# Patient Record
Sex: Female | Born: 1975
Health system: Southern US, Community
[De-identification: ages and names within clinical notes are randomized; demographics above are authoritative.]

## PROBLEM LIST (undated history)

## (undated) DIAGNOSIS — K219 Gastro-esophageal reflux disease without esophagitis: Secondary | ICD-10-CM

## (undated) DIAGNOSIS — E7800 Pure hypercholesterolemia, unspecified: Secondary | ICD-10-CM

## (undated) DIAGNOSIS — G43009 Migraine without aura, not intractable, without status migrainosus: Secondary | ICD-10-CM

## (undated) DIAGNOSIS — K589 Irritable bowel syndrome without diarrhea: Secondary | ICD-10-CM

## (undated) DIAGNOSIS — F32A Depression, unspecified: Secondary | ICD-10-CM

## (undated) DIAGNOSIS — F419 Anxiety disorder, unspecified: Secondary | ICD-10-CM

## (undated) DIAGNOSIS — F329 Major depressive disorder, single episode, unspecified: Secondary | ICD-10-CM

## (undated) DIAGNOSIS — G2581 Restless legs syndrome: Secondary | ICD-10-CM

## (undated) HISTORY — DX: Migraine without aura, not intractable, without status migrainosus: G43.009

## (undated) HISTORY — PX: CHOLECYSTECTOMY, LAPAROSCOPIC: SHX56

## (undated) HISTORY — DX: Anxiety disorder, unspecified: F41.9

## (undated) HISTORY — DX: Restless legs syndrome: G25.81

## (undated) HISTORY — DX: Irritable bowel syndrome, unspecified: K58.9

## (undated) HISTORY — DX: Depression, unspecified: F32.A

## (undated) HISTORY — PX: CHOLECYSTECTOMY: SHX55

## (undated) HISTORY — PX: TONSILLECTOMY AND ADENOIDECTOMY: SUR1326

## (undated) HISTORY — PX: TUBAL LIGATION: SHX77

## (undated) HISTORY — DX: Pure hypercholesterolemia, unspecified: E78.00

---
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
## 1999-08-16 ENCOUNTER — Other Ambulatory Visit: Admission: RE | Admit: 1999-08-16 | Discharge: 1999-08-16 | Payer: Self-pay | Admitting: Obstetrics & Gynecology

## 2000-01-17 ENCOUNTER — Other Ambulatory Visit: Admission: RE | Admit: 2000-01-17 | Discharge: 2000-01-17 | Payer: Self-pay | Admitting: Obstetrics & Gynecology

## 2000-05-11 ENCOUNTER — Ambulatory Visit (HOSPITAL_COMMUNITY): Admission: RE | Admit: 2000-05-11 | Discharge: 2000-05-11 | Payer: Self-pay | Admitting: Unknown Physician Specialty

## 2000-07-25 ENCOUNTER — Inpatient Hospital Stay (HOSPITAL_COMMUNITY): Admission: AD | Admit: 2000-07-25 | Discharge: 2000-07-25 | Payer: Self-pay | Admitting: Obstetrics & Gynecology

## 2000-07-31 ENCOUNTER — Inpatient Hospital Stay (HOSPITAL_COMMUNITY): Admission: AD | Admit: 2000-07-31 | Discharge: 2000-07-31 | Payer: Self-pay | Admitting: Obstetrics and Gynecology

## 2000-08-01 ENCOUNTER — Inpatient Hospital Stay (HOSPITAL_COMMUNITY): Admission: AD | Admit: 2000-08-01 | Discharge: 2000-08-04 | Payer: Self-pay | Admitting: Obstetrics & Gynecology

## 2001-01-13 ENCOUNTER — Emergency Department (HOSPITAL_COMMUNITY): Admission: EM | Admit: 2001-01-13 | Discharge: 2001-01-13 | Payer: Self-pay | Admitting: *Deleted

## 2002-09-21 ENCOUNTER — Other Ambulatory Visit: Admission: RE | Admit: 2002-09-21 | Discharge: 2002-09-21 | Payer: Self-pay | Admitting: Obstetrics & Gynecology

## 2003-09-07 ENCOUNTER — Ambulatory Visit (HOSPITAL_COMMUNITY): Admission: RE | Admit: 2003-09-07 | Discharge: 2003-09-07 | Payer: Self-pay | Admitting: Family Medicine

## 2003-10-12 ENCOUNTER — Other Ambulatory Visit: Admission: RE | Admit: 2003-10-12 | Discharge: 2003-10-12 | Payer: Self-pay | Admitting: Obstetrics & Gynecology

## 2004-05-02 ENCOUNTER — Encounter: Admission: RE | Admit: 2004-05-02 | Discharge: 2004-05-02 | Payer: Self-pay | Admitting: Family Medicine

## 2004-05-06 ENCOUNTER — Ambulatory Visit (HOSPITAL_COMMUNITY): Admission: RE | Admit: 2004-05-06 | Discharge: 2004-05-07 | Payer: Self-pay | Admitting: Surgery

## 2004-05-10 ENCOUNTER — Emergency Department (HOSPITAL_COMMUNITY): Admission: EM | Admit: 2004-05-10 | Discharge: 2004-05-10 | Payer: Self-pay | Admitting: Emergency Medicine

## 2004-05-11 ENCOUNTER — Inpatient Hospital Stay (HOSPITAL_COMMUNITY): Admission: EM | Admit: 2004-05-11 | Discharge: 2004-05-15 | Payer: Self-pay | Admitting: Emergency Medicine

## 2004-11-10 HISTORY — PX: WISDOM TOOTH EXTRACTION: SHX21

## 2004-12-12 ENCOUNTER — Other Ambulatory Visit: Admission: RE | Admit: 2004-12-12 | Discharge: 2004-12-12 | Payer: Self-pay | Admitting: Obstetrics & Gynecology

## 2006-01-13 ENCOUNTER — Other Ambulatory Visit: Admission: RE | Admit: 2006-01-13 | Discharge: 2006-01-13 | Payer: Self-pay | Admitting: Obstetrics & Gynecology

## 2006-11-07 ENCOUNTER — Inpatient Hospital Stay (HOSPITAL_COMMUNITY): Admission: RE | Admit: 2006-11-07 | Discharge: 2006-11-08 | Payer: Self-pay | Admitting: Obstetrics & Gynecology

## 2007-02-22 IMAGING — US US PELVIS COMPLETE
1 series · 14 of 25 positions shown · non-contrast
Comparison: NONE

CLINICAL DATA: Follow-up to CT, growth on right side near uterus 

PELVIC ULTRASOUND

[Series 1: us pelvic · 0.32mm/px · 14 of 40 slices shown]
[im 1/40]
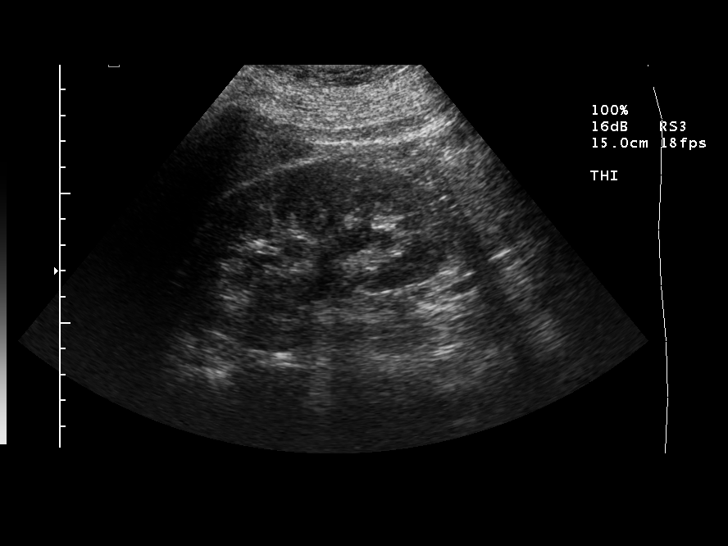
[im 4/40]
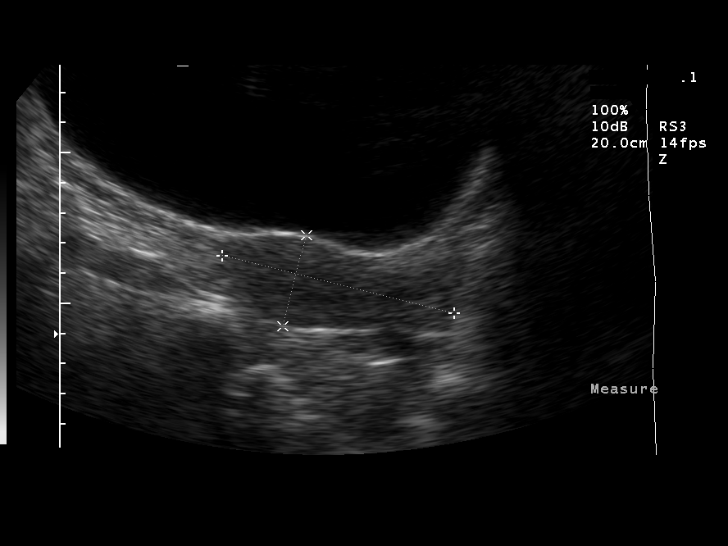
[im 7/40]
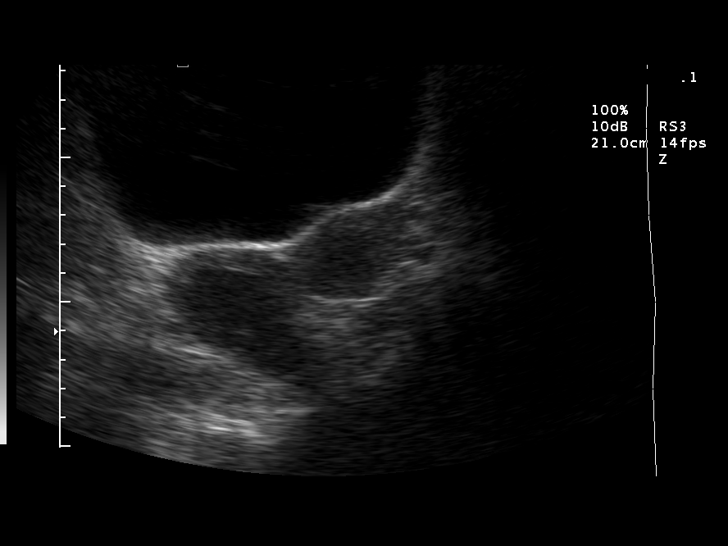
[im 10/40]
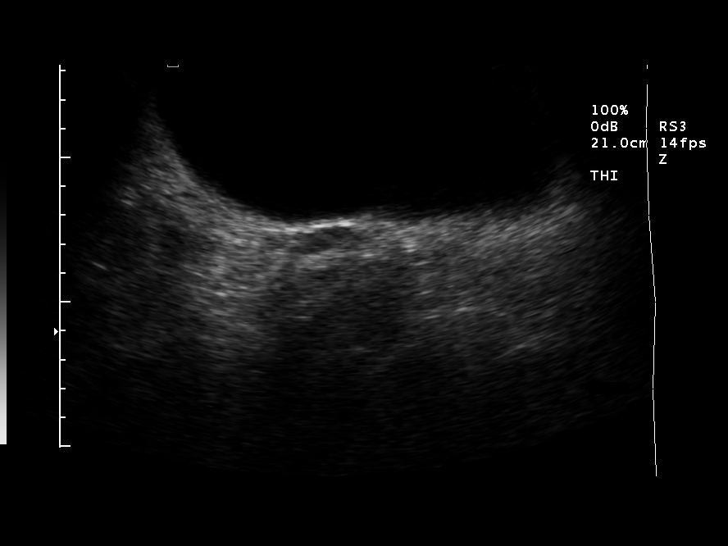
[im 14/40]
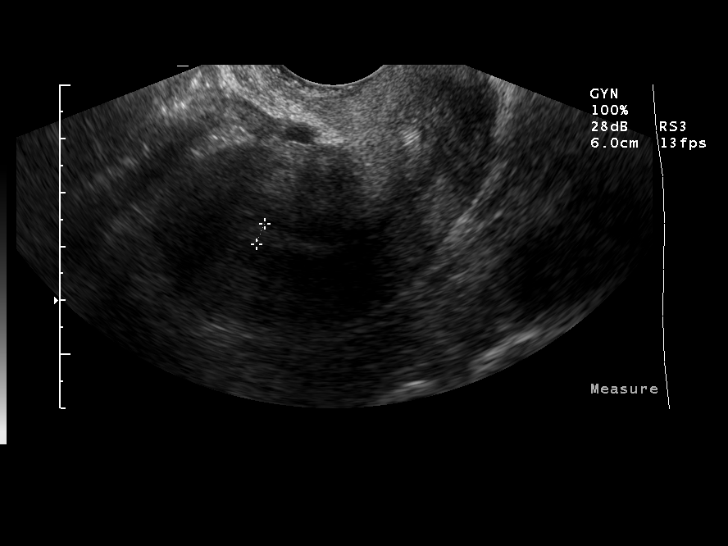
[im 15/40]
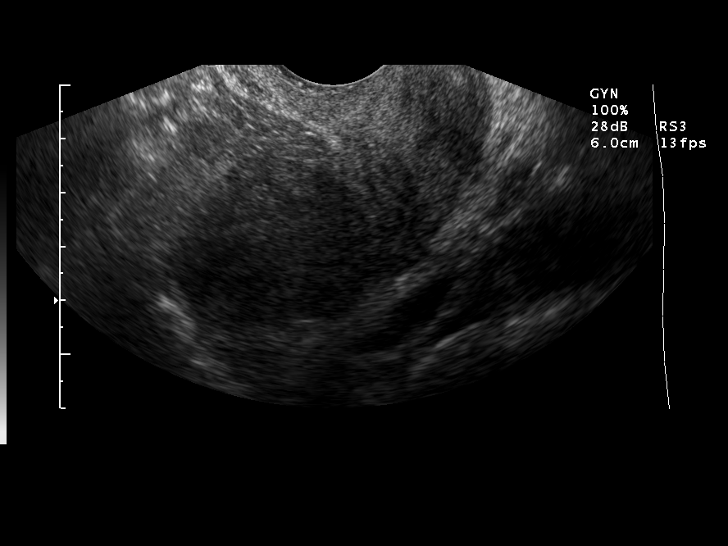
[im 18/40]
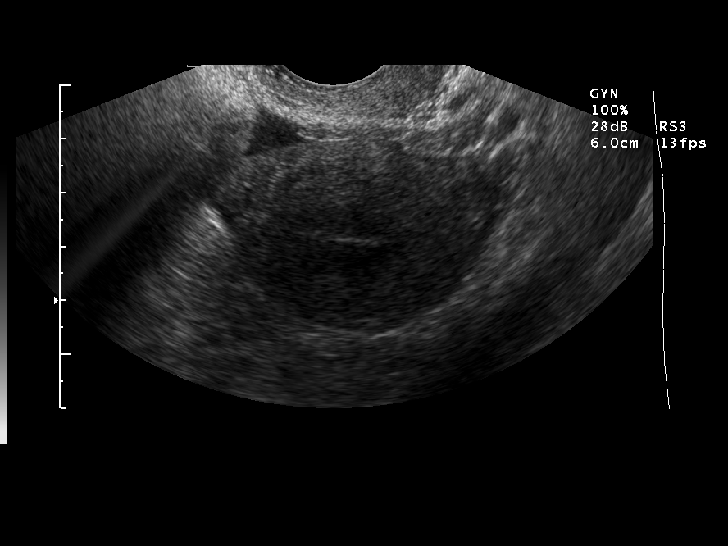
[im 22/40]
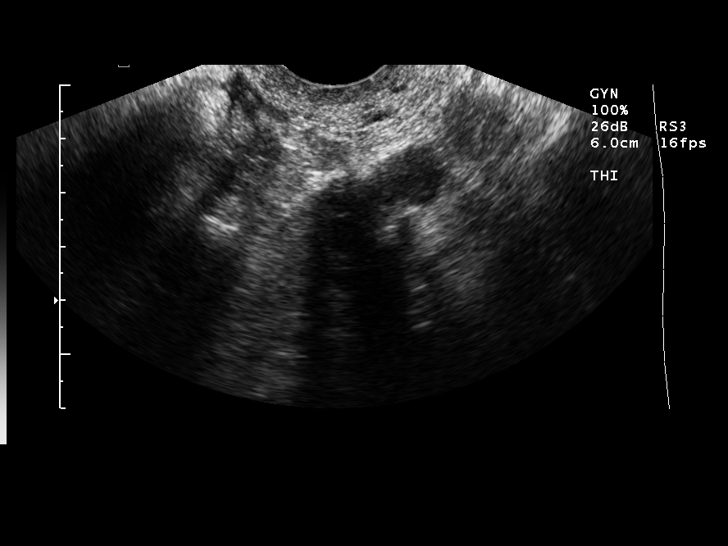
[im 25/40]
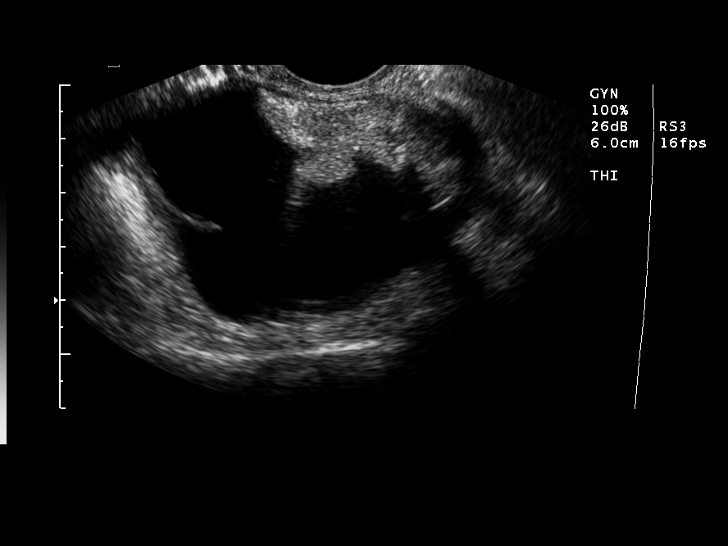
[im 27/40]
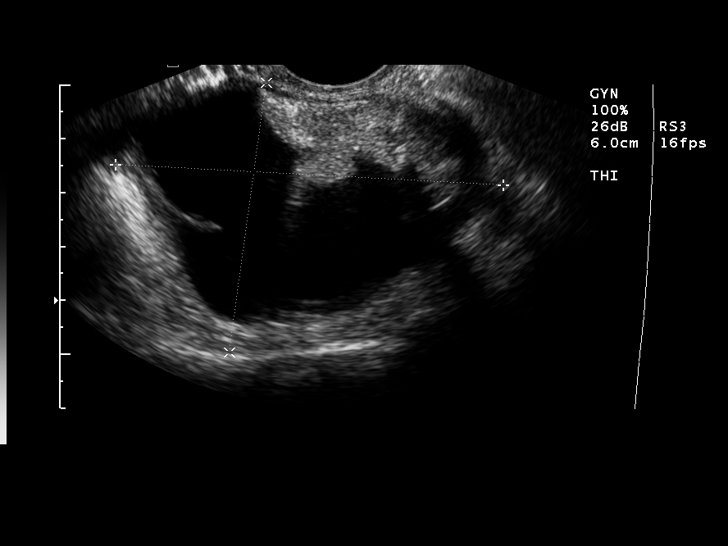
[im 30/40]
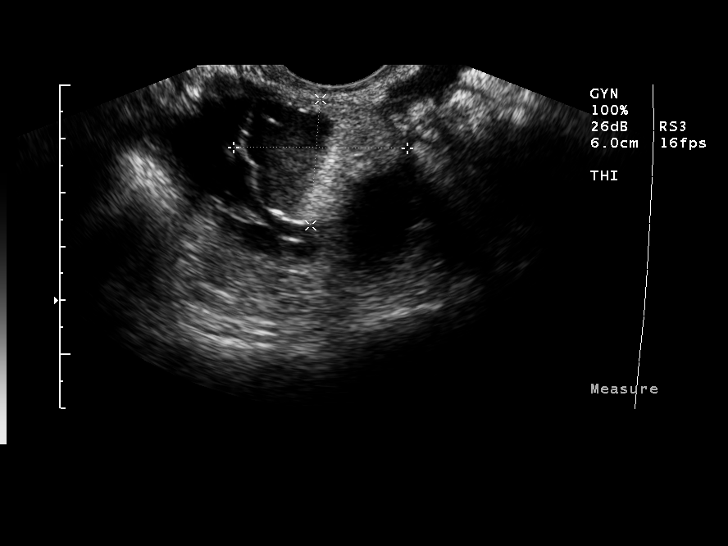
[im 33/40]
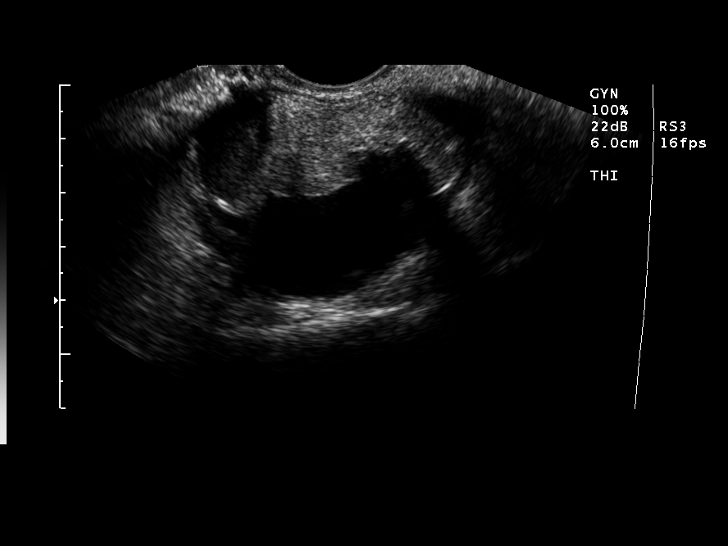
[im 36/40]
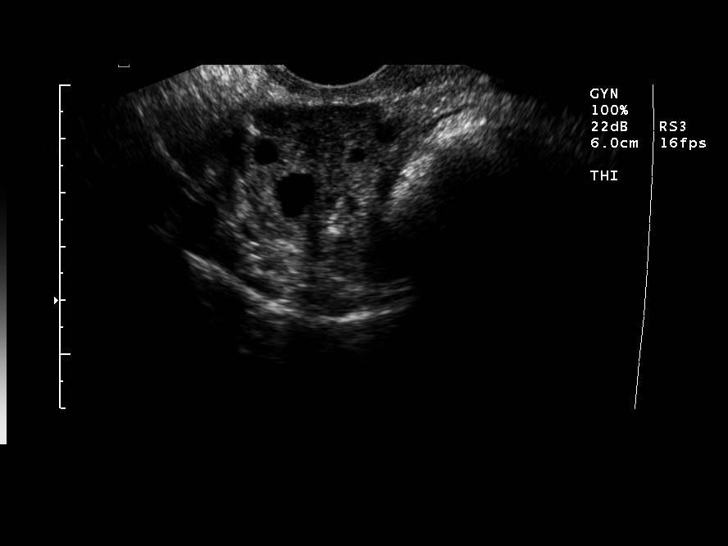
[im 40/40]
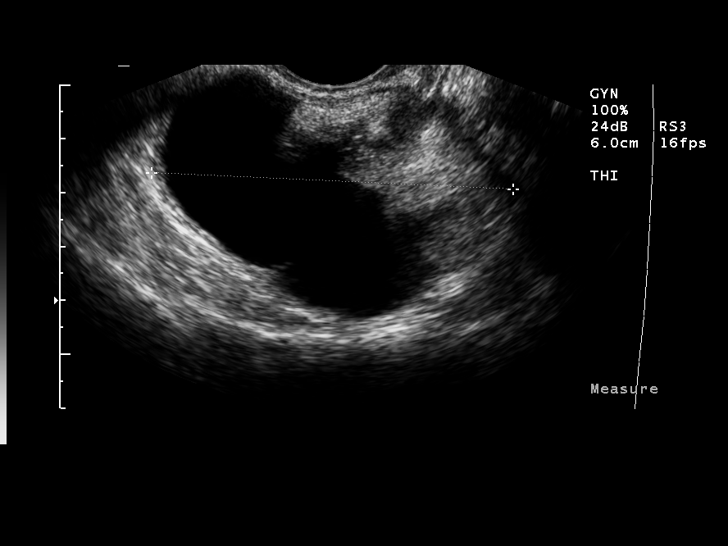

[14 of 25 positions shown; findings below may reference images not displayed]

FINDINGS: The uterus is normal in size and shape. It measures
cm in length, 3.1 cm in its anteroposterior dimension, and 4.3 cm 
in its transverse dimension. Endometrial stripe measures 0.41 cm. 
In the right adnexal area, there is a complex mass which 
corresponds to the CT abnormality measuring 7.2 cm in its greatest 
dimension. This has both fluid and solid components to it. This is 
non-specific, but certainly would be compatible with a CT 
diagnosis of an endometrioma. CT would be the better modality for 
evaluation for endometrioma components. The left ovary is normal 
and measures 1.9 cm in its greatest dimension.
IMPRESSION: 7.2 cm complex mass right adnexal region corresponding 
to the CT abnormality. This is non-specific by ultrasound 
criteria. Brack Tiger, Labelle Salha electronically reviewed on 
10/29/2006 Dict Date: 10/29/2006  Tran Date: 10/29/2006 DAS  [REDACTED]

## 2008-11-10 HISTORY — PX: DERMOID CYST  EXCISION: SHX1452

## 2015-08-06 ENCOUNTER — Other Ambulatory Visit: Payer: Self-pay | Admitting: Obstetrics & Gynecology

## 2015-08-07 LAB — CYTOLOGY - PAP

## 2017-04-16 ENCOUNTER — Encounter: Payer: Self-pay | Admitting: Obstetrics & Gynecology

## 2017-05-11 DIAGNOSIS — G2581 Restless legs syndrome: Secondary | ICD-10-CM | POA: Insufficient documentation

## 2020-01-13 ENCOUNTER — Encounter: Payer: Self-pay | Admitting: *Deleted

## 2020-01-31 ENCOUNTER — Encounter: Payer: 59 | Admitting: Obstetrics & Gynecology

## 2020-02-20 ENCOUNTER — Other Ambulatory Visit: Payer: Self-pay

## 2020-02-21 ENCOUNTER — Encounter: Payer: Self-pay | Admitting: Obstetrics & Gynecology

## 2020-02-21 ENCOUNTER — Ambulatory Visit (INDEPENDENT_AMBULATORY_CARE_PROVIDER_SITE_OTHER): Payer: 59 | Admitting: Obstetrics & Gynecology

## 2020-02-21 VITALS — BP 150/92 | HR 120 | Temp 97.2°F | Resp 22 | Ht 64.0 in | Wt 297.0 lb

## 2020-02-21 DIAGNOSIS — Z8669 Personal history of other diseases of the nervous system and sense organs: Secondary | ICD-10-CM

## 2020-02-21 DIAGNOSIS — N921 Excessive and frequent menstruation with irregular cycle: Secondary | ICD-10-CM | POA: Diagnosis not present

## 2020-02-21 DIAGNOSIS — Z6841 Body Mass Index (BMI) 40.0 and over, adult: Secondary | ICD-10-CM | POA: Diagnosis not present

## 2020-02-21 DIAGNOSIS — R030 Elevated blood-pressure reading, without diagnosis of hypertension: Secondary | ICD-10-CM | POA: Diagnosis not present

## 2020-02-21 NOTE — Progress Notes (Signed)
44 y.o. G70P0011 Married White or Caucasian female here for irregular bleeding.  She is here for a second opinion about her irregular bleeding.  She has been followed by Dr. Nori Riis for many years.  She reports a release of records has been signed but I do not have any outside records.  Pt reports hx of long, heavy cycles for several years.  She underwent an evaluation in 2018 with Dr. Nori Riis.  IUD and hysterectomy and OCPS were discussed  At that time, she had an ultrasound as well.  She decided to have this treated with OCPs and has been on Isibloom.  She is taking continuous active pills.  She is now having daily bleeding in additional to heavy cycles.    She recently saw Dr. Nori Riis again and he again recommended the same things for her except he did not offer hysterectomy which is her preference at this time.       BP is elevated today.  She states it was at the [facility] with Dr. Nori Riis as well.  He switched her to POPs which she will start after finishing current pack.  She has undergone a BTL as well.    PCP:  Dr. Kenton Kingfisher  Patient's last menstrual period was 10/11/2019 (approximate).          Sexually active: Yes.    The current method of family planning is tubal ligation/OCPs--Isibloom.    Exercising: No.  The patient does not participate in regular exercise at present. Smoker:  no  Health Maintenance: Pap: 2018 normal per patient, 08-06-15 Neg  History of abnormal Pap:  no MMG:  2018 normal per patient Colonoscopy:  n/a BMD:   n/a TDaP: wihtnin last 10 years Pneumonia vaccine(s):  no Shingrix:   no Hep C testing:no Screening Labs: none today   reports that she has never smoked. She has never used smokeless tobacco. She reports previous alcohol use. She reports that she does not use drugs.  Past Medical History:  Diagnosis Date  . Anxiety   . Depression   . Hypercholesterolemia   . IBS (irritable bowel syndrome)   . Migraine without aura   . Restless leg syndrome     Past Surgical  History:  Procedure Laterality Date  . cholescystectomy     pt.states complication--"sleepy colon and kidney"  . DERMOID CYST  EXCISION  2010  . TONSILLECTOMY AND ADENOIDECTOMY     age 89  . TUBAL LIGATION      Current Outpatient Medications  Medication Sig Dispense Refill  . ALPRAZolam (XANAX) 0.25 MG tablet Take 0.25 mg by mouth at bedtime as needed for anxiety.    . cetirizine (ZYRTEC) 10 MG tablet Take 10 mg by mouth daily.    . ISIBLOOM 0.15-30 MG-MCG tablet     . pantoprazole (PROTONIX) 40 MG tablet Take 40 mg by mouth daily.    . pravastatin (PRAVACHOL) 40 MG tablet Take 40 mg by mouth daily.    Marland Kitchen rOPINIRole (REQUIP) 2 MG tablet Take 2 mg by mouth at bedtime.    . SUMAtriptan (IMITREX) 100 MG tablet Take 100 mg by mouth every 2 (two) hours as needed for migraine. May repeat in 2 hours if headache persists or recurs.    . topiramate (TOPAMAX) 50 MG tablet Take 50 mg by mouth daily.    Marland Kitchen venlafaxine XR (EFFEXOR-XR) 75 MG 24 hr capsule Take 225 mg by mouth daily.     No current facility-administered medications for this visit.  Family History  Problem Relation Age of Onset  . Heart attack Father 27       Dec  . Kidney disease Brother   . Cancer Maternal Grandmother 75       Dec from colon ca  . Breast cancer Paternal Grandmother 68  . Cancer Paternal Grandfather 82       Dec from colon ca    Review of Systems  All other systems reviewed and are negative.   Exam:   BP (!) 150/92 (Cuff Size: Large)   Pulse (!) 120   Temp (!) 97.2 F (36.2 C) (Temporal)   Resp (!) 22   Ht 5\' 4"  (1.626 m)   Wt 297 lb (134.7 kg)   LMP 10/11/2019 (Approximate)   BMI 50.98 kg/m   Height: 5\' 4"  (162.6 cm)  Ht Readings from Last 3 Encounters:  02/21/20 5\' 4"  (1.626 m)    General appearance: alert, cooperative and appears stated age Head: Normocephalic, without obvious abnormality, atraumatic Neck: no adenopathy, supple, symmetrical, trachea midline and thyroid normal to  inspection and palpation Lungs: clear to auscultation bilaterally Breasts: normal appearance, no masses or tenderness Heart: regular rate and rhythm Abdomen: soft, non-tender; bowel sounds normal; no masses,  no organomegaly Extremities: extremities normal, atraumatic, no cyanosis or edema Skin: Skin color, texture, turgor normal. No rashes or lesions Lymph nodes: Cervical, supraclavicular, and axillary nodes normal. No abnormal inguinal nodes palpated Neurologic: Grossly normal   Pelvic: External genitalia:  no lesions              Urethra:  normal appearing urethra with no masses, tenderness or lesions              Bartholins and Skenes: normal                 Vagina: normal appearing vagina with normal color and discharge, no lesions              Cervix: no lesions              Pap taken: No. Bimanual Exam:  Uterus:  normal size, contour, position, consistency, mobility, non-tender              Adnexa: normal adnexa and no mass, fullness, tenderness               Rectovaginal: Confirms               Anus:  normal sphincter tone, no lesions  Chaperone, Terence Lux, CMA, was present for exam.  A:  H/o menorrhagia now with irregular bleeding between Morbid obesity with BMI >50 Elevated BP H/o dermoid cyst removal with low transverse incision present  P:   Outside records release signed today.  Pt is probably going to need an endometrial biopsy Endorsed switching from combination OCP to POP due to increased risk of stroke with combination OCPs Pap was obtained and held until outside records are received and reviewed  Treatment options with IUD, POPs, other progesterone methods, ablation and hysterectomy were discussed.  Pt is aware I am concerned about obesity and surgical risks although I think surgery could be done.  Will need to have complete evaluation before ultimately deciding about best treatment options.

## 2020-02-24 ENCOUNTER — Encounter: Payer: Self-pay | Admitting: Obstetrics & Gynecology

## 2020-02-24 DIAGNOSIS — Z6841 Body Mass Index (BMI) 40.0 and over, adult: Secondary | ICD-10-CM | POA: Insufficient documentation

## 2020-02-24 DIAGNOSIS — N921 Excessive and frequent menstruation with irregular cycle: Secondary | ICD-10-CM | POA: Insufficient documentation

## 2020-02-24 DIAGNOSIS — R030 Elevated blood-pressure reading, without diagnosis of hypertension: Secondary | ICD-10-CM | POA: Insufficient documentation

## 2020-02-24 DIAGNOSIS — Z8669 Personal history of other diseases of the nervous system and sense organs: Secondary | ICD-10-CM | POA: Insufficient documentation

## 2020-03-13 ENCOUNTER — Telehealth: Payer: Self-pay | Admitting: Obstetrics & Gynecology

## 2020-03-13 DIAGNOSIS — N921 Excessive and frequent menstruation with irregular cycle: Secondary | ICD-10-CM

## 2020-03-13 NOTE — Telephone Encounter (Signed)
Patient was seen in office for menorrhagia w/ irregular cycles on 02/21/20. Per review of OV notes outside records release completed. Will likely need EMB. Pap was obtained and held until outside records received and reviewed.    Dr. Sabra Heck -have you received outside records?

## 2020-03-13 NOTE — Telephone Encounter (Signed)
Patient states she is following up to see if her results are in yet.Patient states she is calling to get some appointment scheduled .

## 2020-03-15 ENCOUNTER — Other Ambulatory Visit: Payer: Self-pay | Admitting: Obstetrics & Gynecology

## 2020-03-15 ENCOUNTER — Other Ambulatory Visit (HOSPITAL_COMMUNITY)
Admission: RE | Admit: 2020-03-15 | Discharge: 2020-03-15 | Disposition: A | Discharge status: Home / Self Care | Payer: 59 | Source: Ambulatory Visit | Attending: Obstetrics & Gynecology | Admitting: Obstetrics & Gynecology

## 2020-03-15 DIAGNOSIS — Z124 Encounter for screening for malignant neoplasm of cervix: Secondary | ICD-10-CM | POA: Insufficient documentation

## 2020-03-15 NOTE — Telephone Encounter (Signed)
I did receive two ultrasound reports and her pap smear result.  That was all.  No notes or anything else.  I don't want to try and get anything else.  Her pap smear has been ordered as she has not had HR HPV testing done.  She does need an endometrial biopsy.  Ok to proceed with scheduling.

## 2020-03-15 NOTE — Telephone Encounter (Signed)
Spoke with patient, advised per Dr. Sabra Heck. BTL for contraceptive. EMB scheduled for 5/20 at 4pm. Patient declined earlier appt due to her work schedule. Advised patient if she finds that she can be seen sooner, return call to office. Advised to take Motrin 800 mg with food and water one hour before procedure. Patient verbalizes understanding and is agreeable.   Routing to provider for final review. Patient is agreeable to disposition. Will close encounter.  Cc: Magdalene Patricia, Hayley Carder

## 2020-03-16 LAB — CYTOLOGY - PAP
Comment: NEGATIVE
Diagnosis: NEGATIVE
High risk HPV: NEGATIVE

## 2020-03-21 ENCOUNTER — Telehealth: Payer: Self-pay | Admitting: Obstetrics & Gynecology

## 2020-03-21 NOTE — Telephone Encounter (Signed)
Patient returned my call and I conveyed the benefits. Patient understands/agreeable with the benefits. Patient is aware of the cancellation policy. Appointment scheduled 03/29/20.

## 2020-03-21 NOTE — Telephone Encounter (Signed)
Call placed to convey benefits for endometrial biopsy. 

## 2020-03-28 ENCOUNTER — Other Ambulatory Visit: Payer: Self-pay

## 2020-03-29 ENCOUNTER — Ambulatory Visit (INDEPENDENT_AMBULATORY_CARE_PROVIDER_SITE_OTHER): Payer: 59 | Admitting: Obstetrics & Gynecology

## 2020-03-29 ENCOUNTER — Other Ambulatory Visit (HOSPITAL_COMMUNITY)
Admission: RE | Admit: 2020-03-29 | Discharge: 2020-03-29 | Disposition: A | Discharge status: Home / Self Care | Payer: 59 | Source: Ambulatory Visit | Attending: Obstetrics & Gynecology | Admitting: Obstetrics & Gynecology

## 2020-03-29 ENCOUNTER — Encounter: Payer: Self-pay | Admitting: Obstetrics & Gynecology

## 2020-03-29 VITALS — BP 122/88 | HR 88 | Temp 97.0°F | Resp 12 | Ht 64.0 in | Wt 299.0 lb

## 2020-03-29 DIAGNOSIS — Z6841 Body Mass Index (BMI) 40.0 and over, adult: Secondary | ICD-10-CM

## 2020-03-29 DIAGNOSIS — N393 Stress incontinence (female) (male): Secondary | ICD-10-CM

## 2020-03-29 DIAGNOSIS — N921 Excessive and frequent menstruation with irregular cycle: Secondary | ICD-10-CM

## 2020-03-29 NOTE — Progress Notes (Signed)
GYNECOLOGY  VISIT  HPI: 44 y.o. G85P1011 Married White or Caucasian female here for EMB.  Pt reports bleeding stopped with switching to POPs and blood pressure is much better today.  Outside records were scanty so we need to repeat some of the evaluation for her menorrhagia.  She is desirous of definitive treatment.  She also has hx of SUI and is desirous of evaluation and treatment for this.  Pt has hx of BTL for contraception.  Pap 03/15/2020 was normal with neg HR HPV.  GYNECOLOGIC HISTORY: Patient's last menstrual period was 10/11/2019. Contraception: POP/ tubal ligation Menopausal hormone therapy: none  Patient Active Problem List   Diagnosis Date Noted  . Hx of migraines 02/24/2020  . Elevated BP without diagnosis of hypertension 02/24/2020  . BMI 50.0-59.9, adult ([place]) 02/24/2020  . Menorrhagia with irregular cycle 02/24/2020    Past Medical History:  Diagnosis Date  . Anxiety   . Depression   . Hypercholesterolemia   . IBS (irritable bowel syndrome)   . Migraine without aura   . Restless leg syndrome     Past Surgical History:  Procedure Laterality Date  . CHOLECYSTECTOMY, LAPAROSCOPIC     pt.states complication--"sleepy colon and kidney"  . DERMOID CYST  EXCISION  2010  . TONSILLECTOMY AND ADENOIDECTOMY     age 42  . TUBAL LIGATION      MEDS:   Current Outpatient Medications on File Prior to Visit  Medication Sig Dispense Refill  . ALPRAZolam (XANAX) 0.25 MG tablet Take 0.25 mg by mouth at bedtime as needed for anxiety.    Marland Kitchen buPROPion (WELLBUTRIN XL) 300 MG 24 hr tablet Take 300 mg by mouth daily.    . cetirizine (ZYRTEC) 10 MG tablet Take 10 mg by mouth daily.    . norethindrone (MICRONOR) 0.35 MG tablet Take 1 tablet by mouth daily.    . pantoprazole (PROTONIX) 40 MG tablet Take 40 mg by mouth daily.    . pravastatin (PRAVACHOL) 40 MG tablet Take 40 mg by mouth daily.    Marland Kitchen rOPINIRole (REQUIP) 2 MG tablet Take 2 mg by mouth at bedtime.    . SUMAtriptan (IMITREX)  100 MG tablet Take 100 mg by mouth every 2 (two) hours as needed for migraine. May repeat in 2 hours if headache persists or recurs.    . topiramate (TOPAMAX) 50 MG tablet Take 50 mg by mouth daily.    Marland Kitchen venlafaxine XR (EFFEXOR-XR) 75 MG 24 hr capsule Take 225 mg by mouth daily.     No current facility-administered medications on file prior to visit.    ALLERGIES: Sulfa antibiotics  Family History  Problem Relation Age of Onset  . Heart attack Father 11       Dec  . Kidney disease Brother   . Cancer Maternal Grandmother 75       Dec from colon ca  . Breast cancer Paternal Grandmother 41  . Cancer Paternal Grandfather 82       Dec from colon ca    SH:  Married, non smoker  Review of Systems  All other systems reviewed and are negative.   PHYSICAL EXAMINATION:    BP 122/88 (BP Location: Right Arm, Patient Position: Sitting, Cuff Size: Large)   Pulse 88   Temp (!) 97 F (36.1 C) (Temporal)   Resp 12   Ht 5\' 4"  (1.626 m)   Wt 299 lb (135.6 kg)   LMP 10/11/2019   BMI 51.32 kg/m     General  appearance: alert, cooperative and appears stated age Lymph:  no inguinal LAD noted  Pelvic: External genitalia:  no lesions              Urethra:  normal appearing urethra with no masses, tenderness or lesions              Bartholins and Skenes: normal                 Vagina: normal appearing vagina with normal color and discharge, no lesions              Cervix: no lesions              Bimanual Exam:  Uterus:  normal size, contour, position, consistency, mobility, non-tender  Endometrial biopsy recommended.  Discussed with patient.  Verbal and written consent obtained.   Procedure:  Speculum placed.  Cervix visualized and cleansed with betadine prep.  A single toothed tenaculum was applied to the anterior lip of the cervix.  Endometrial pipelle was advanced through the cervix into the endometrial cavity without difficulty.  Pipelle passed to 7.5cm.  Suction applied and pipelle removed  with good tissue sample obtained.  Two passes were performed.  Tenculum removed.  No bleeding noted.  Patient tolerated procedure well.  Chaperone, Terence Lux, CMA, was present for exam.  Assessment: Menorrhagia with prolonged menstrual cycle that finally stopped with Micronor Morbid obesity SUI Elevated BP at prior visit but normal today (possibly after stopping estrogen containing OCPs)  Plan: Endometrial biopsy pending.   Will refer for additional evaluation of SUI and treatment recommendations.   About 20 minutes spent in discussion with pt prior to endometrial biopsy.

## 2020-04-02 LAB — SURGICAL PATHOLOGY

## 2020-04-16 ENCOUNTER — Telehealth: Payer: Self-pay | Admitting: *Deleted

## 2020-04-16 NOTE — Telephone Encounter (Signed)
Routing to Dr. Sabra Heck to review 03/29/20 EMB results.

## 2020-04-16 NOTE — Telephone Encounter (Signed)
Patient calling for results of biopsy.

## 2020-04-18 NOTE — Telephone Encounter (Signed)
Spoke with pt. Pt given results and recommendations per Dr Sabra Heck. Pt agreeable and verbalized understanding.  Pt states is available for phone call from Dr Sabra Heck on Thursday or Friday:   Lunch time 1245-1:15pm or anytime after 4:30 pm on mobile/home number that is listed in chart.   Routing to  Dr Sabra Heck for review.  Encounter closed.

## 2020-04-18 NOTE — Telephone Encounter (Signed)
Please let her know her pathology was negative for abnormal cells.  She is interested in a hysterectomy and desires treatment for bladder issues.  I have communicated with the two providers that I refer to for bladder issues and both have concerns about how successful this will be due to her weight.  She is around 300 pounds.  She and I need to talk about options.  Can you see when would be a good time to call her tomorrow or Friday?  Thanks.

## 2020-08-16 ENCOUNTER — Telehealth: Payer: Self-pay

## 2020-08-16 NOTE — Telephone Encounter (Signed)
Patient called wanting to schedule a hysterectomy.

## 2020-08-17 NOTE — Telephone Encounter (Signed)
OV 02/2020- new pt, menorrhagia with irreg bleeding.  OV 5/21-EMB= see note below per [facility] LMP 08/13/20  Spoke with pt. Pt states wanting to discuss having a hysterectomy. Pt states still having monthly cycles that she is changing a pad/tampon every 2 hours with small clots. Denies feeling weak, lightheaded or dizzy.  States taking Micronor Rx as prescribed daily and bleeding has improved, but having BTB since May due to migraine medication.  Pt states would like surgery by the end of the year due to met deductible. Pt advised Dr Sabra Heck leaving the practice in Nov and does not have any surgery time available. Pt advised can wait to have next year with Dr Sabra Heck at new practice or see another provider here in our office and have a consult. Pt agreeable to see another provider. Pt scheduled for consult on  10/19 at 930 am with Dr Talbert Nan. Pt verbalized understanding to date and time of appt.   Routing to Dr Sabra Heck for review  Cc: Dr Talbert Nan  Cc: Verline Lema, RN-surgery planning Encounter closed.  Note re: EMB 04/16/20  Megan Salon, MD Note Please let her know her pathology was negative for abnormal cells.  She is interested in a hysterectomy and desires treatment for bladder issues.  I have communicated with the two providers that I refer to for bladder issues and both have concerns about how successful this will be due to her weight.  She is around 300 pounds.  She and I need to talk about options.  Can you see when would be a good time to call her tomorrow or Friday?  Thanks.

## 2020-08-28 ENCOUNTER — Encounter: Payer: Self-pay | Admitting: Obstetrics and Gynecology

## 2020-08-28 ENCOUNTER — Ambulatory Visit (INDEPENDENT_AMBULATORY_CARE_PROVIDER_SITE_OTHER): Payer: 59 | Admitting: Obstetrics and Gynecology

## 2020-08-28 ENCOUNTER — Other Ambulatory Visit: Payer: Self-pay

## 2020-08-28 VITALS — BP 122/78 | HR 85 | Ht 64.0 in | Wt 298.0 lb

## 2020-08-28 DIAGNOSIS — N3946 Mixed incontinence: Secondary | ICD-10-CM | POA: Diagnosis not present

## 2020-08-28 DIAGNOSIS — Z6841 Body Mass Index (BMI) 40.0 and over, adult: Secondary | ICD-10-CM

## 2020-08-28 DIAGNOSIS — N920 Excessive and frequent menstruation with regular cycle: Secondary | ICD-10-CM

## 2020-08-28 MED ORDER — IBUPROFEN 800 MG PO TABS: 800.0000 mg | ORAL_TABLET | Freq: Three times a day (TID) | ORAL | 1 refills | Status: DC | PRN

## 2020-08-28 NOTE — Progress Notes (Signed)
GYNECOLOGY  VISIT   HPI: 44 y.o.   Married White or Caucasian Not Hispanic or Latino  female   [phone] with Patient's last menstrual period was 08/13/2020.   here to discuss surgery. She on week one day one of her pill pack.   She has a h/o menorrhagia on OCP's, switched to POP in the spring secondary to HTN.   Her cycles have been a problem for at least 5 years. When she she saw Dr Sabra Heck in the spring she was on continuous OCP's.  Had been on the pill cyclically, but was bleeding very heavily. She was switched to continuous OCP's ~2 years ago. She was initially better, but was getting worse, she was bleeding all the time.  Labs with her primary from 11/21: normal TSH, HgbA1C 5.6%, Normal LFT, normal creatinine, elevated lipids. Hgb was 13.3 in 11/21  No dyspareunia.  She has some urinary leakage, sometimes doesn't know she has to go and her pad is wet. She does leak with cough and sneeze, also leaks on the way to the bathroom. She tried her mom's oxybutynin and it didn't help. She leaks daily, wears a moderate pad, not always soaked through. Drinks a 12 oz cup of coffee a day.   In May 2021 she was switched to POP, initially bleeding every day. Now she is bleeding monthly x 4 days. She is changing her pad every 2 hours, large clots. Cramps are moderate. Hard to leave the house with her heavy bleeding. All she does is work and sleep the week of her cycle, she is exhausted during her cycle.  She had an ultrasound earlier this year with her prior GYN and was told it was normal.  Endometrial biopsy in 5/21 was normal. Pap negative with negative HPV in 5/21.   She had a large ovarian dermoid removed via laparotomy in 2006 at the time a tubal ligation was done.   She is in a weight loss program on line that is managed by an MD. She is on metformin from the on line program and is starting to loose weight. Gets on line support, meds and coaching on line.  Dr Kenton Kingfisher, at [facility], is her primary.    Son is 50. 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   GYNECOLOGIC HISTORY: Patient's last menstrual period was 08/13/2020. Contraception:Tubal ligation  Menopausal hormone therapy: none         OB History    Gravida  2   Para  1   Term  1   Preterm      AB  1   Living  1     SAB  1   TAB      Ectopic      Multiple      Live Births                 Patient Active Problem List   Diagnosis Date Noted  . Hx of migraines 02/24/2020  . Elevated BP without diagnosis of hypertension 02/24/2020  . BMI 50.0-59.9, adult ([place]) 02/24/2020  . Menorrhagia with irregular cycle 02/24/2020    Past Medical History:  Diagnosis Date  . Anxiety   . Depression   . Hypercholesterolemia   . IBS (irritable bowel syndrome)   . Migraine without aura   . Restless leg syndrome     Past Surgical History:  Procedure Laterality Date  . CHOLECYSTECTOMY, LAPAROSCOPIC     pt.states complication--"sleepy colon and kidney"  . DERMOID  CYST  EXCISION  2010  . TONSILLECTOMY AND ADENOIDECTOMY     age 44  . TUBAL LIGATION      Current Outpatient Medications  Medication Sig Dispense Refill  . ALPRAZolam (XANAX) 0.25 MG tablet Take 0.25 mg by mouth at bedtime as needed for anxiety.    Marland Kitchen buPROPion (WELLBUTRIN XL) 300 MG 24 hr tablet Take 300 mg by mouth daily.    . cetirizine (ZYRTEC) 10 MG tablet Take 10 mg by mouth daily.    . metFORMIN (GLUCOPHAGE-XR) 500 MG 24 hr tablet Take 500 mg by mouth daily with breakfast. For weight loss.    . norethindrone (MICRONOR) 0.35 MG tablet Take 1 tablet by mouth daily.    . pantoprazole (PROTONIX) 40 MG tablet Take 40 mg by mouth daily.    . pravastatin (PRAVACHOL) 40 MG tablet Take 40 mg by mouth daily.    Marland Kitchen rOPINIRole (REQUIP) 2 MG tablet Take 2 mg by mouth at bedtime.    . SUMAtriptan (IMITREX) 100 MG tablet Take 100 mg by mouth every 2 (two) hours as needed for migraine. May repeat in 2 hours if headache persists or recurs.    . topiramate  (TOPAMAX) 50 MG tablet Take 50 mg by mouth daily.    Marland Kitchen venlafaxine XR (EFFEXOR-XR) 75 MG 24 hr capsule Take 225 mg by mouth daily.     No current facility-administered medications for this visit.     ALLERGIES: Sulfa antibiotics  Family History  Problem Relation Age of Onset  . Heart attack Father 44       Dec  . Kidney disease Brother   . Cancer Maternal Grandmother 75       Dec from colon ca  . Breast cancer Paternal Grandmother 53  . Cancer Paternal Grandfather 81       Dec from colon ca  Father died of heart disease at 4.   Social History   Socioeconomic History  . Marital status: Married    Spouse name: Not on file  . Number of children: Not on file  . Years of education: Not on file  . Highest education level: Not on file  Occupational History  . Not on file  Tobacco Use  . Smoking status: Never Smoker  . Smokeless tobacco: Never Used  Vaping Use  . Vaping Use: Never used  Substance and Sexual Activity  . Alcohol use: Not Currently  . Drug use: Never  . Sexual activity: Yes    Birth control/protection: Pill    Comment: Micronor  Other Topics Concern  . Not on file  Social History Narrative  . Not on file   Social Determinants of Health   Financial Resource Strain:   . Difficulty of Paying Living Expenses: Not on file  Food Insecurity:   . Worried About Charity fundraiser in the Last Year: Not on file  . Ran Out of Food in the Last Year: Not on file  Transportation Needs:   . Lack of Transportation (Medical): Not on file  . Lack of Transportation (Non-Medical): Not on file  Physical Activity:   . Days of Exercise per Week: Not on file  . Minutes of Exercise per Session: Not on file  Stress:   . Feeling of Stress : Not on file  Social Connections:   . Frequency of Communication with Friends and Family: Not on file  . Frequency of Social Gatherings with Friends and Family: Not on file  . Attends Religious Services: Not  on file  . Active Member of  Clubs or Organizations: Not on file  . Attends Archivist Meetings: Not on file  . Marital Status: Not on file  Intimate Partner Violence:   . Fear of Current or Ex-Partner: Not on file  . Emotionally Abused: Not on file  . Physically Abused: Not on file  . Sexually Abused: Not on file    Review of Systems  All other systems reviewed and are negative.   PHYSICAL EXAMINATION:    BP 122/78   Pulse 85   Ht 5\' 4"  (1.626 m)   Wt 298 lb (135.2 kg)   LMP 08/13/2020   SpO2 100%   BMI 51.15 kg/m     General appearance: alert, cooperative and appears stated age Abdomen: soft, non-tender; non distended, no masses,  no organomegaly  Pelvic: External genitalia:  no lesions              Urethra:  normal appearing urethra with no masses, tenderness or lesions              Bartholins and Skenes: normal                 Vagina: normal appearing vagina with normal color and discharge, no lesions. No descensus.               Cervix: no cervical motion tenderness and no lesions              Bimanual Exam:  Uterus:  mobile, not tender, not appreciably enlarged. Exam limited by BMI.               Adnexa: no mass, fullness, tenderness                Chaperone was present for exam.  ASSESSMENT Menorrhagia even on POP, she desires definitive surgery Mixed urinary incontinence BMI 51    PLAN Can try using NSAID's on her heavy days, ibuprofen called in Discussed the possible option of lysteda Discussed the mirena IUD Discussed the option of an endometrial ablation Discussed the option of total laparoscopic hysterectomy After reviewing the options she would like [place] to try to appeal the [place] IUD.  Not interested in endometrial ablation.  If she can't have the IUD, she would like to proceed with TLH. She is aware of the risks and that her weight contributes to risks with surgery. Will refer to PT for urinary incontinence. She is working on weight loss  Over 40 minutes spent in  total patient care.

## 2020-08-28 NOTE — Patient Instructions (Signed)

## 2020-08-28 NOTE — H&P (View-Only) (Signed)
GYNECOLOGY  VISIT   HPI: 44 y.o.   Married White or Caucasian Not Hispanic or Latino  female   [phone] with Patient's last menstrual period was 08/13/2020.   here to discuss surgery. She on week one day one of her pill pack.   She has a h/o menorrhagia on OCP's, switched to POP in the spring secondary to HTN.   Her cycles have been a problem for at least 5 years. When she she saw Dr Sabra Heck in the spring she was on continuous OCP's.  Had been on the pill cyclically, but was bleeding very heavily. She was switched to continuous OCP's ~2 years ago. She was initially better, but was getting worse, she was bleeding all the time.  Labs with her primary from 11/21: normal TSH, HgbA1C 5.6%, Normal LFT, normal creatinine, elevated lipids. Hgb was 13.3 in 11/21  No dyspareunia.  She has some urinary leakage, sometimes doesn't know she has to go and her pad is wet. She does leak with cough and sneeze, also leaks on the way to the bathroom. She tried her mom's oxybutynin and it didn't help. She leaks daily, wears a moderate pad, not always soaked through. Drinks a 12 oz cup of coffee a day.   In May 2021 she was switched to POP, initially bleeding every day. Now she is bleeding monthly x 4 days. She is changing her pad every 2 hours, large clots. Cramps are moderate. Hard to leave the house with her heavy bleeding. All she does is work and sleep the week of her cycle, she is exhausted during her cycle.  She had an ultrasound earlier this year with her prior GYN and was told it was normal.  Endometrial biopsy in 5/21 was normal. Pap negative with negative HPV in 5/21.   She had a large ovarian dermoid removed via laparotomy in 2006 at the time a tubal ligation was done.   She is in a weight loss program on line that is managed by an MD. She is on metformin from the on line program and is starting to loose weight. Gets on line support, meds and coaching on line.  Dr Kenton Kingfisher, at [place], is her primary.    Son is 50. 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   GYNECOLOGIC HISTORY: Patient's last menstrual period was 08/13/2020. Contraception:Tubal ligation  Menopausal hormone therapy: none         OB History    Gravida  2   Para  1   Term  1   Preterm      AB  1   Living  1     SAB  1   TAB      Ectopic      Multiple      Live Births                 Patient Active Problem List   Diagnosis Date Noted  . Hx of migraines 02/24/2020  . Elevated BP without diagnosis of hypertension 02/24/2020  . BMI 50.0-59.9, adult ([place]) 02/24/2020  . Menorrhagia with irregular cycle 02/24/2020    Past Medical History:  Diagnosis Date  . Anxiety   . Depression   . Hypercholesterolemia   . IBS (irritable bowel syndrome)   . Migraine without aura   . Restless leg syndrome     Past Surgical History:  Procedure Laterality Date  . CHOLECYSTECTOMY, LAPAROSCOPIC     pt.states complication--"sleepy colon and kidney"  . DERMOID  CYST  EXCISION  2010  . TONSILLECTOMY AND ADENOIDECTOMY     age 25  . TUBAL LIGATION      Current Outpatient Medications  Medication Sig Dispense Refill  . ALPRAZolam (XANAX) 0.25 MG tablet Take 0.25 mg by mouth at bedtime as needed for anxiety.    Marland Kitchen buPROPion (WELLBUTRIN XL) 300 MG 24 hr tablet Take 300 mg by mouth daily.    . cetirizine (ZYRTEC) 10 MG tablet Take 10 mg by mouth daily.    . metFORMIN (GLUCOPHAGE-XR) 500 MG 24 hr tablet Take 500 mg by mouth daily with breakfast. For weight loss.    . norethindrone (MICRONOR) 0.35 MG tablet Take 1 tablet by mouth daily.    . pantoprazole (PROTONIX) 40 MG tablet Take 40 mg by mouth daily.    . pravastatin (PRAVACHOL) 40 MG tablet Take 40 mg by mouth daily.    Marland Kitchen rOPINIRole (REQUIP) 2 MG tablet Take 2 mg by mouth at bedtime.    . SUMAtriptan (IMITREX) 100 MG tablet Take 100 mg by mouth every 2 (two) hours as needed for migraine. May repeat in 2 hours if headache persists or recurs.    . topiramate  (TOPAMAX) 50 MG tablet Take 50 mg by mouth daily.    Marland Kitchen venlafaxine XR (EFFEXOR-XR) 75 MG 24 hr capsule Take 225 mg by mouth daily.     No current facility-administered medications for this visit.     ALLERGIES: Sulfa antibiotics  Family History  Problem Relation Age of Onset  . Heart attack Father 61       Dec  . Kidney disease Brother   . Cancer Maternal Grandmother 75       Dec from colon ca  . Breast cancer Paternal Grandmother 63  . Cancer Paternal Grandfather 74       Dec from colon ca  Father died of heart disease at 40.   Social History   Socioeconomic History  . Marital status: Married    Spouse name: Not on file  . Number of children: Not on file  . Years of education: Not on file  . Highest education level: Not on file  Occupational History  . Not on file  Tobacco Use  . Smoking status: Never Smoker  . Smokeless tobacco: Never Used  Vaping Use  . Vaping Use: Never used  Substance and Sexual Activity  . Alcohol use: Not Currently  . Drug use: Never  . Sexual activity: Yes    Birth control/protection: Pill    Comment: Micronor  Other Topics Concern  . Not on file  Social History Narrative  . Not on file   Social Determinants of Health   Financial Resource Strain:   . Difficulty of Paying Living Expenses: Not on file  Food Insecurity:   . Worried About Charity fundraiser in the Last Year: Not on file  . Ran Out of Food in the Last Year: Not on file  Transportation Needs:   . Lack of Transportation (Medical): Not on file  . Lack of Transportation (Non-Medical): Not on file  Physical Activity:   . Days of Exercise per Week: Not on file  . Minutes of Exercise per Session: Not on file  Stress:   . Feeling of Stress : Not on file  Social Connections:   . Frequency of Communication with Friends and Family: Not on file  . Frequency of Social Gatherings with Friends and Family: Not on file  . Attends Religious Services: Not  on file  . Active Member of  Clubs or Organizations: Not on file  . Attends Archivist Meetings: Not on file  . Marital Status: Not on file  Intimate Partner Violence:   . Fear of Current or Ex-Partner: Not on file  . Emotionally Abused: Not on file  . Physically Abused: Not on file  . Sexually Abused: Not on file    Review of Systems  All other systems reviewed and are negative.   PHYSICAL EXAMINATION:    BP 122/78   Pulse 85   Ht 5\' 4"  (1.626 m)   Wt 298 lb (135.2 kg)   LMP 08/13/2020   SpO2 100%   BMI 51.15 kg/m     General appearance: alert, cooperative and appears stated age Abdomen: soft, non-tender; non distended, no masses,  no organomegaly  Pelvic: External genitalia:  no lesions              Urethra:  normal appearing urethra with no masses, tenderness or lesions              Bartholins and Skenes: normal                 Vagina: normal appearing vagina with normal color and discharge, no lesions. No descensus.               Cervix: no cervical motion tenderness and no lesions              Bimanual Exam:  Uterus:  mobile, not tender, not appreciably enlarged. Exam limited by BMI.               Adnexa: no mass, fullness, tenderness                Chaperone was present for exam.  ASSESSMENT Menorrhagia even on POP, she desires definitive surgery Mixed urinary incontinence BMI 51    PLAN Can try using NSAID's on her heavy days, ibuprofen called in Discussed the possible option of lysteda Discussed the mirena IUD Discussed the option of an endometrial ablation Discussed the option of total laparoscopic hysterectomy After reviewing the options she would like [place] to try to appeal the [place] IUD.  Not interested in endometrial ablation.  If she can't have the IUD, she would like to proceed with TLH. She is aware of the risks and that her weight contributes to risks with surgery. Will refer to PT for urinary incontinence. She is working on weight loss  Over 40 minutes spent in  total patient care.

## 2020-08-29 ENCOUNTER — Encounter: Payer: Self-pay | Admitting: Obstetrics and Gynecology

## 2020-09-04 ENCOUNTER — Telehealth: Payer: Self-pay

## 2020-09-04 NOTE — Telephone Encounter (Signed)
Patient is returning call. Patient states she will be available at 10:45am tomorrow.

## 2020-09-04 NOTE — Telephone Encounter (Signed)
Call to patient. Per DPR, OK to leave message on voicemail.   Left voicemail requesting a return call to Giada Schoppe to review benefits and schedule recommended surgery with Jill Jertson, MD. 

## 2020-09-04 NOTE — Telephone Encounter (Signed)
Patient returned call. She will call again on her break at 2:45.

## 2020-09-05 NOTE — Telephone Encounter (Signed)
Spoke with patient regarding surgery benefits. Patient acknowledges understanding of information presented. Patient is aware that benefits presented are for professional benefits only. Patient is aware that once surgery is scheduled, the hospital will call with separate benefits. Patient is aware of surgery cancellation policy.  Patient stated that she would call back to proceed with scheduling surgery. Patient aware to ask for [facility] or [place].

## 2020-09-06 NOTE — Telephone Encounter (Signed)
Patient is calling to proceed with scheduling. Patient states can be reached tomorrow at these times: 10:45-11, 12:45-1:15, and after 4:30.

## 2020-09-07 NOTE — Telephone Encounter (Signed)
Spoke with patient. Patient would like to proceed with surgery scheduling. TLH/BS/cysto scheduled for 09/25/2020 at 0730 at [facility] COVID test scheduled for 09/21/2020 at 3:15 pm at [facility] location. Patient is aware of the need to quarantine after test until surgery. 1 week post op scheduled for 10/02/2020 at 1:15 pm with Dr.Jertson. 4 week post op scheduled for 10/23/2020 at 1:15 pm with Dr.Jertson. Surgery instructions reviewed and mailed to patient's verified address.   Routing to provider and will close encounter.

## 2020-09-17 DIAGNOSIS — Z0289 Encounter for other administrative examinations: Secondary | ICD-10-CM

## 2020-09-21 ENCOUNTER — Other Ambulatory Visit (HOSPITAL_COMMUNITY)
Admission: RE | Admit: 2020-09-21 | Discharge: 2020-09-21 | Disposition: A | Discharge status: Home / Self Care | Payer: 59 | Source: Ambulatory Visit | Attending: Obstetrics and Gynecology | Admitting: Obstetrics and Gynecology

## 2020-09-21 ENCOUNTER — Encounter (HOSPITAL_COMMUNITY)
Admission: RE | Admit: 2020-09-21 | Discharge: 2020-09-21 | Disposition: A | Discharge status: Home / Self Care | Payer: 59 | Source: Ambulatory Visit | Attending: Obstetrics and Gynecology | Admitting: Obstetrics and Gynecology

## 2020-09-21 ENCOUNTER — Encounter (HOSPITAL_COMMUNITY): Payer: Self-pay

## 2020-09-21 ENCOUNTER — Other Ambulatory Visit: Payer: Self-pay

## 2020-09-21 DIAGNOSIS — Z20822 Contact with and (suspected) exposure to covid-19: Secondary | ICD-10-CM | POA: Diagnosis not present

## 2020-09-21 DIAGNOSIS — Z01812 Encounter for preprocedural laboratory examination: Secondary | ICD-10-CM | POA: Insufficient documentation

## 2020-09-21 HISTORY — DX: Gastro-esophageal reflux disease without esophagitis: K21.9

## 2020-09-21 LAB — CBC
HCT: 40.8 % (ref 36.0–46.0)
Hemoglobin: 12.7 g/dL (ref 12.0–15.0)
MCH: 25 pg — ABNORMAL LOW (ref 26.0–34.0)
MCHC: 31.1 g/dL (ref 30.0–36.0)
MCV: 80.3 fL (ref 80.0–100.0)
Platelets: 282 10*3/uL (ref 150–400)
RBC: 5.08 MIL/uL (ref 3.87–5.11)
RDW: 15.8 % — ABNORMAL HIGH (ref 11.5–15.5)
WBC: 7.8 10*3/uL (ref 4.0–10.5)
nRBC: 0 % (ref 0.0–0.2)

## 2020-09-21 LAB — COMPREHENSIVE METABOLIC PANEL
ALT: 21 U/L (ref 0–44)
AST: 17 U/L (ref 15–41)
Albumin: 3.8 g/dL (ref 3.5–5.0)
Alkaline Phosphatase: 62 U/L (ref 38–126)
Anion gap: 7 (ref 5–15)
BUN: 10 mg/dL (ref 6–20)
CO2: 21 mmol/L — ABNORMAL LOW (ref 22–32)
Calcium: 9.1 mg/dL (ref 8.9–10.3)
Chloride: 109 mmol/L (ref 98–111)
Creatinine, Ser: 1.06 mg/dL — ABNORMAL HIGH (ref 0.44–1.00)
GFR, Estimated: >60 mL/min (ref >60)
Glucose, Bld: 87 mg/dL (ref 70–99)
Potassium: 4.3 mmol/L (ref 3.5–5.1)
Sodium: 137 mmol/L (ref 135–145)
Total Bilirubin: 0.7 mg/dL (ref 0.3–1.2)
Total Protein: 6.6 g/dL (ref 6.5–8.1)

## 2020-09-21 LAB — TYPE AND SCREEN
ABO/RH(D): A NEG
Antibody Screen: NEGATIVE

## 2020-09-21 LAB — SARS CORONAVIRUS 2 (TAT 6-24 HRS): SARS Coronavirus 2: NEGATIVE

## 2020-09-21 NOTE — Pre-Procedure Instructions (Signed)
PCP - Shirline Frees  Cardiologist - denies  PPM/ICD - denies   Chest x-ray - n/a EKG - n/a Stress Test - denies ECHO - denies Cardiac Cath -denies  Sleep Study - denies   No diabetes   Patient instructed to hold all Aspirin, NSAID's, herbal medications, fish oil and vitamins 7 days prior to surgery.   ERAS Protcol -n/a   COVID TEST- scheduled 09/21/2020 3:15pm   Anesthesia review: no  Patient denies shortness of breath, fever, cough and chest pain at PAT appointment   All instructions explained to the patient, with a verbal understanding of the material. Patient agrees to go over the instructions while at home for a better understanding. Patient also instructed to self quarantine after being tested for COVID-19. The opportunity to ask questions was provided.

## 2020-09-21 NOTE — Pre-Procedure Instructions (Signed)
Lauren Wilcox  09/21/2020     Your procedure is scheduled on Tuesday, November 16  Report to [facility], Main Entrance or Entrance "A" at 5:30 AM                 Your surgery or procedure is scheduled to begin at 7:30 AM   Call this number if you have problems the morning of surgery: [phone]  This is the number for the Pre- Surgical Desk.                For any other questions, please call [phone], Monday - Friday 8 AM - 4 PM.   Remember:  Do not eat or drink after midnight.                      Take these medicines the morning of surgery with A SIP OF WATER: buPROPion (WELLBUTRIN XL) norethindrone (MICRONOR) pantoprazole (PROTONIX)  pravastatin (PRAVACHOL)  topiramate (TOPAMAX)  venlafaxine XR [facility])  May take if needed: ALPRAZolam Duanne Moron)    STOP taking Aspirin, Aspirin Products (Goody Powder, Excedrin Migraine), Ibuprofen (Advil), Naproxen (Aleve), Vitamins and Herbal Products (ie Fish Oil).   Special instructions:    [facility]- Preparing For Surgery  Before surgery, you can play an important role. Because skin is not sterile, your skin needs to be as free of germs as possible. You can reduce the number of germs on your skin by washing with CHG (chlorahexidine gluconate) Soap before surgery.  CHG is an antiseptic cleaner which kills germs and bonds with the skin to continue killing germs even after washing.    Oral Hygiene is also important to reduce your risk of infection.  Remember - BRUSH YOUR TEETH THE MORNING OF SURGERY WITH YOUR REGULAR TOOTHPASTE  Please do not use if you have an allergy to CHG or antibacterial soaps. If your skin becomes reddened/irritated stop using the CHG.  Do not shave (including legs and underarms) for at least 48 hours prior to first CHG shower. It is OK to shave your face.  Please follow these instructions carefully.   1. Shower the NIGHT BEFORE SURGERY and the MORNING OF SURGERY with CHG.   2. If you  chose to wash your hair, wash your hair first as usual with your normal shampoo.  3. After you shampoo, wash your face and private area with the soap you use at home, then rinse your hair and body thoroughly to remove the shampoo and soap.  4. Use CHG as you would any other liquid soap. You can apply CHG directly to the skin and wash gently with a scrungie or a clean washcloth.   5. Apply the CHG Soap to your body ONLY FROM THE NECK DOWN.  Do not use on open wounds or open sores. Avoid contact with your eyes, ears, mouth and genitals (private parts).   6. Wash thoroughly, paying special attention to the area where your surgery will be performed.  7. Thoroughly rinse your body with warm water from the neck down.  8. DO NOT shower/wash with your normal soap after using and rinsing off the CHG Soap.  9. Pat yourself dry with a CLEAN TOWEL.  10. Wear CLEAN PAJAMAS to bed the night before surgery, wear comfortable clothes the morning of surgery  11. Place CLEAN SHEETS on your bed the night of your first shower and DO NOT SLEEP WITH PETS.  Day of Surgery: Shower as instructed above.  Do not apply any deodorants/lotions, powders or colognes.  Please wear clean clothes to the hospital/surgery center.   Remember to brush your teeth WITH YOUR REGULAR TOOTHPASTE.  Do not wear jewelry, make-up or nail polish.  Do not shave 48 hours prior to surgery.  Men may shave face and neck.  Do not bring valuables to the hospital.  [facility] is not responsible for any belongings or valuables.  Contacts, dentures or bridgework may not be worn into surgery.  Leave your suitcase in the car.  After surgery it may be brought to your room.  For patients admitted to the hospital, discharge time will be determined by your treatment team.  Patients discharged the day of surgery will not be allowed to drive home.   Please read over the fact sheets that you were given.

## 2020-09-24 NOTE — Anesthesia Preprocedure Evaluation (Addendum)
Anesthesia Evaluation  Patient identified by MRN, date of birth, ID band Patient awake    Reviewed: Allergy & Precautions, NPO status , Patient's Chart, lab work & pertinent test results  History of Anesthesia Complications Negative for: history of anesthetic complications  Airway Mallampati: II  TM Distance: >3 FB Neck ROM: Full    Dental  (+) Dental Advisory Given, Teeth Intact   Pulmonary neg pulmonary ROS,    Pulmonary exam normal        Cardiovascular negative cardio ROS Normal cardiovascular exam     Neuro/Psych  Headaches, PSYCHIATRIC DISORDERS Anxiety Depression  RLS     GI/Hepatic Neg liver ROS, GERD  Medicated and Controlled, IBS    Endo/Other  Morbid obesity  Renal/GU negative Renal ROS     Musculoskeletal negative musculoskeletal ROS (+)   Abdominal (+) + obese,   Peds  Hematology negative hematology ROS (+)   Anesthesia Other Findings Covid test negative   Reproductive/Obstetrics s/p tubal ligation                             Anesthesia Physical Anesthesia Plan  ASA: III  Anesthesia Plan: General   Post-op Pain Management:    Induction: Intravenous  PONV Risk Score and Plan: 4 or greater and Ondansetron, Dexamethasone, Midazolam, Treatment may vary due to age or medical condition and Scopolamine patch - Pre-op  Airway Management Planned: Oral ETT  Additional Equipment: None  Intra-op Plan:   Post-operative Plan: Extubation in OR  Informed Consent: I have reviewed the patients History and Physical, chart, labs and discussed the procedure including the risks, benefits and alternatives for the proposed anesthesia with the patient or authorized representative who has indicated his/her understanding and acceptance.     Dental advisory given  Plan Discussed with: CRNA and Anesthesiologist  Anesthesia Plan Comments:        Anesthesia Quick  Evaluation

## 2020-09-25 ENCOUNTER — Encounter (HOSPITAL_COMMUNITY)
Admission: RE | Disposition: A | Discharge status: Home / Self Care | Payer: Self-pay | Source: Home / Self Care | Attending: Obstetrics and Gynecology

## 2020-09-25 ENCOUNTER — Other Ambulatory Visit: Payer: Self-pay

## 2020-09-25 ENCOUNTER — Ambulatory Visit (HOSPITAL_COMMUNITY)
Admission: RE | Admit: 2020-09-25 | Discharge: 2020-09-25 | Disposition: A | Discharge status: Home / Self Care | Payer: 59 | Attending: Obstetrics and Gynecology | Admitting: Obstetrics and Gynecology

## 2020-09-25 ENCOUNTER — Encounter (HOSPITAL_COMMUNITY): Payer: Self-pay | Admitting: Obstetrics and Gynecology

## 2020-09-25 ENCOUNTER — Ambulatory Visit (HOSPITAL_COMMUNITY): Payer: 59 | Admitting: Anesthesiology

## 2020-09-25 DIAGNOSIS — N920 Excessive and frequent menstruation with regular cycle: Secondary | ICD-10-CM | POA: Diagnosis present

## 2020-09-25 DIAGNOSIS — D259 Leiomyoma of uterus, unspecified: Secondary | ICD-10-CM | POA: Insufficient documentation

## 2020-09-25 DIAGNOSIS — Z882 Allergy status to sulfonamides status: Secondary | ICD-10-CM | POA: Insufficient documentation

## 2020-09-25 DIAGNOSIS — Z7984 Long term (current) use of oral hypoglycemic drugs: Secondary | ICD-10-CM | POA: Insufficient documentation

## 2020-09-25 DIAGNOSIS — Z90710 Acquired absence of both cervix and uterus: Secondary | ICD-10-CM | POA: Diagnosis present

## 2020-09-25 DIAGNOSIS — N3946 Mixed incontinence: Secondary | ICD-10-CM | POA: Insufficient documentation

## 2020-09-25 DIAGNOSIS — N8302 Follicular cyst of left ovary: Secondary | ICD-10-CM | POA: Insufficient documentation

## 2020-09-25 DIAGNOSIS — N888 Other specified noninflammatory disorders of cervix uteri: Secondary | ICD-10-CM

## 2020-09-25 DIAGNOSIS — Z79899 Other long term (current) drug therapy: Secondary | ICD-10-CM | POA: Insufficient documentation

## 2020-09-25 HISTORY — PX: TOTAL LAPAROSCOPIC HYSTERECTOMY WITH SALPINGECTOMY: SHX6742

## 2020-09-25 HISTORY — PX: CYSTOSCOPY: SHX5120

## 2020-09-25 LAB — CBC
HCT: 40.7 % (ref 36.0–46.0)
Hemoglobin: 12.8 g/dL (ref 12.0–15.0)
MCH: 24.9 pg — ABNORMAL LOW (ref 26.0–34.0)
MCHC: 31.4 g/dL (ref 30.0–36.0)
MCV: 79.2 fL — ABNORMAL LOW (ref 80.0–100.0)
Platelets: 250 10*3/uL (ref 150–400)
RBC: 5.14 MIL/uL — ABNORMAL HIGH (ref 3.87–5.11)
RDW: 15.6 % — ABNORMAL HIGH (ref 11.5–15.5)
WBC: 9.1 10*3/uL (ref 4.0–10.5)
nRBC: 0 % (ref 0.0–0.2)

## 2020-09-25 LAB — COMPREHENSIVE METABOLIC PANEL
ALT: 24 U/L (ref 0–44)
AST: 24 U/L (ref 15–41)
Albumin: 3.5 g/dL (ref 3.5–5.0)
Alkaline Phosphatase: 58 U/L (ref 38–126)
Anion gap: 11 (ref 5–15)
BUN: 12 mg/dL (ref 6–20)
CO2: 19 mmol/L — ABNORMAL LOW (ref 22–32)
Calcium: 8.9 mg/dL (ref 8.9–10.3)
Chloride: 108 mmol/L (ref 98–111)
Creatinine, Ser: 0.99 mg/dL (ref 0.44–1.00)
GFR, Estimated: >60 mL/min (ref >60)
Glucose, Bld: 143 mg/dL — ABNORMAL HIGH (ref 70–99)
Potassium: 4.1 mmol/L (ref 3.5–5.1)
Sodium: 138 mmol/L (ref 135–145)
Total Bilirubin: 0.4 mg/dL (ref 0.3–1.2)
Total Protein: 6.4 g/dL — ABNORMAL LOW (ref 6.5–8.1)

## 2020-09-25 LAB — POCT PREGNANCY, URINE: Preg Test, Ur: NEGATIVE

## 2020-09-25 SURGERY — HYSTERECTOMY, TOTAL, LAPAROSCOPIC, WITH SALPINGECTOMY
Anesthesia: General | Site: Bladder

## 2020-09-25 MED ORDER — ONDANSETRON HCL 4 MG/2ML IJ SOLN: 4.0000 mg | Freq: Four times a day (QID) | INTRAMUSCULAR | Status: DC | PRN

## 2020-09-25 MED ORDER — PROMETHAZINE HCL 25 MG/ML IJ SOLN
INTRAMUSCULAR | Status: AC
  Filled 2020-09-25: qty 1

## 2020-09-25 MED ORDER — LIDOCAINE 2% (20 MG/ML) 5 ML SYRINGE
INTRAMUSCULAR | Status: DC | PRN
  Administered 2020-09-25: 60 mg via INTRAVENOUS

## 2020-09-25 MED ORDER — ROPINIROLE HCL 0.5 MG PO TABS: 2.0000 mg | ORAL_TABLET | Freq: Every day | ORAL | Status: DC

## 2020-09-25 MED ORDER — DOCUSATE SODIUM 100 MG PO CAPS: 100.0000 mg | ORAL_CAPSULE | Freq: Two times a day (BID) | ORAL | Status: DC

## 2020-09-25 MED ORDER — MENTHOL 3 MG MT LOZG: 1.0000 | LOZENGE | OROMUCOSAL | Status: DC | PRN

## 2020-09-25 MED ORDER — DEXAMETHASONE SODIUM PHOSPHATE 10 MG/ML IJ SOLN
INTRAMUSCULAR | Status: DC | PRN
  Administered 2020-09-25: 10 mg via INTRAVENOUS

## 2020-09-25 MED ORDER — PROPOFOL 10 MG/ML IV BOLUS
INTRAVENOUS | Status: DC | PRN
  Administered 2020-09-25: 200 mg via INTRAVENOUS

## 2020-09-25 MED ORDER — PRAVASTATIN SODIUM 40 MG PO TABS: 40.0000 mg | ORAL_TABLET | Freq: Every day | ORAL | Status: DC

## 2020-09-25 MED ORDER — ACETAMINOPHEN 500 MG PO TABS
1000.0000 mg | ORAL_TABLET | Freq: Four times a day (QID) | ORAL | Status: DC
  Administered 2020-09-25 (×2): 1000 mg via ORAL
  Filled 2020-09-25 (×2): qty 2

## 2020-09-25 MED ORDER — TOPIRAMATE 25 MG PO TABS: 50.0000 mg | ORAL_TABLET | Freq: Every day | ORAL | Status: DC

## 2020-09-25 MED ORDER — BUPIVACAINE HCL (PF) 0.25 % IJ SOLN
INTRAMUSCULAR | Status: AC
  Filled 2020-09-25: qty 30

## 2020-09-25 MED ORDER — SUGAMMADEX SODIUM 200 MG/2ML IV SOLN
INTRAVENOUS | Status: DC | PRN
  Administered 2020-09-25: 200 mg via INTRAVENOUS

## 2020-09-25 MED ORDER — LACTATED RINGERS IV SOLN: INTRAVENOUS | Status: DC

## 2020-09-25 MED ORDER — SODIUM CHLORIDE 0.9 % IV SOLN
INTRAVENOUS | Status: DC | PRN
  Administered 2020-09-25: 120 mL

## 2020-09-25 MED ORDER — FENTANYL CITRATE (PF) 250 MCG/5ML IJ SOLN
INTRAMUSCULAR | Status: DC | PRN
  Administered 2020-09-25: 50 ug via INTRAVENOUS
  Administered 2020-09-25: 100 ug via INTRAVENOUS
  Administered 2020-09-25 (×2): 50 ug via INTRAVENOUS

## 2020-09-25 MED ORDER — OXYCODONE HCL 5 MG/5ML PO SOLN: 5.0000 mg | Freq: Once | ORAL | Status: DC | PRN

## 2020-09-25 MED ORDER — OXYCODONE HCL 5 MG PO TABS: 5.0000 mg | ORAL_TABLET | Freq: Once | ORAL | Status: DC | PRN

## 2020-09-25 MED ORDER — FENTANYL CITRATE (PF) 100 MCG/2ML IJ SOLN
25.0000 ug | INTRAMUSCULAR | Status: DC | PRN
  Administered 2020-09-25: 50 ug via INTRAVENOUS

## 2020-09-25 MED ORDER — DOCUSATE SODIUM 100 MG PO CAPS
100.0000 mg | ORAL_CAPSULE | Freq: Two times a day (BID) | ORAL | 0 refills | Status: DC
Start: 2020-09-25 — End: 2021-09-19

## 2020-09-25 MED ORDER — FENTANYL CITRATE (PF) 250 MCG/5ML IJ SOLN
INTRAMUSCULAR | Status: AC
  Filled 2020-09-25: qty 5

## 2020-09-25 MED ORDER — FENTANYL CITRATE (PF) 100 MCG/2ML IJ SOLN
INTRAMUSCULAR | Status: AC
  Filled 2020-09-25: qty 2

## 2020-09-25 MED ORDER — SODIUM CHLORIDE 0.9 % IV SOLN
2.0000 g | INTRAVENOUS | Status: AC
  Administered 2020-09-25: 2 g via INTRAVENOUS
  Filled 2020-09-25: qty 2

## 2020-09-25 MED ORDER — SCOPOLAMINE 1 MG/3DAYS TD PT72
MEDICATED_PATCH | TRANSDERMAL | Status: AC
  Filled 2020-09-25: qty 1

## 2020-09-25 MED ORDER — BUPIVACAINE HCL (PF) 0.25 % IJ SOLN
INTRAMUSCULAR | Status: DC | PRN
  Administered 2020-09-25: 10 mL

## 2020-09-25 MED ORDER — SCOPOLAMINE 1 MG/3DAYS TD PT72
MEDICATED_PATCH | TRANSDERMAL | Status: DC | PRN
  Administered 2020-09-25: 1 via TRANSDERMAL

## 2020-09-25 MED ORDER — PHENYLEPHRINE 40 MCG/ML (10ML) SYRINGE FOR IV PUSH (FOR BLOOD PRESSURE SUPPORT)
PREFILLED_SYRINGE | INTRAVENOUS | Status: AC
  Filled 2020-09-25: qty 10

## 2020-09-25 MED ORDER — KETOROLAC TROMETHAMINE 30 MG/ML IJ SOLN: 30.0000 mg | Freq: Once | INTRAMUSCULAR | Status: DC

## 2020-09-25 MED ORDER — SUMATRIPTAN SUCCINATE 100 MG PO TABS
100.0000 mg | ORAL_TABLET | ORAL | Status: DC | PRN
  Filled 2020-09-25: qty 1

## 2020-09-25 MED ORDER — PROPOFOL 10 MG/ML IV BOLUS
INTRAVENOUS | Status: AC
  Filled 2020-09-25: qty 20

## 2020-09-25 MED ORDER — ONDANSETRON HCL 4 MG PO TABS: 4.0000 mg | ORAL_TABLET | Freq: Four times a day (QID) | ORAL | Status: DC | PRN

## 2020-09-25 MED ORDER — ONDANSETRON HCL 4 MG/2ML IJ SOLN
INTRAMUSCULAR | Status: DC | PRN
  Administered 2020-09-25: 4 mg via INTRAVENOUS

## 2020-09-25 MED ORDER — ROCURONIUM BROMIDE 10 MG/ML (PF) SYRINGE
PREFILLED_SYRINGE | INTRAVENOUS | Status: AC
  Filled 2020-09-25: qty 10

## 2020-09-25 MED ORDER — ONDANSETRON HCL 4 MG/2ML IJ SOLN
INTRAMUSCULAR | Status: AC
  Filled 2020-09-25: qty 2

## 2020-09-25 MED ORDER — POVIDONE-IODINE 10 % EX SWAB: 2.0000 "application " | Freq: Once | CUTANEOUS | Status: DC

## 2020-09-25 MED ORDER — SODIUM CHLORIDE 0.9 % IR SOLN
Status: DC | PRN
  Administered 2020-09-25 (×2): 3000 mL
  Administered 2020-09-25: 1000 mL

## 2020-09-25 MED ORDER — ACETAMINOPHEN 500 MG PO TABS
1000.0000 mg | ORAL_TABLET | Freq: Four times a day (QID) | ORAL | 0 refills | Status: DC
Start: 2020-09-25 — End: 2021-09-19

## 2020-09-25 MED ORDER — SUCCINYLCHOLINE CHLORIDE 200 MG/10ML IV SOSY
PREFILLED_SYRINGE | INTRAVENOUS | Status: DC | PRN
  Administered 2020-09-25: 170 mg via INTRAVENOUS

## 2020-09-25 MED ORDER — AMISULPRIDE (ANTIEMETIC) 5 MG/2ML IV SOLN
10.0000 mg | Freq: Once | INTRAVENOUS | Status: AC | PRN
  Administered 2020-09-25: 10 mg via INTRAVENOUS

## 2020-09-25 MED ORDER — PANTOPRAZOLE SODIUM 40 MG PO TBEC
40.0000 mg | DELAYED_RELEASE_TABLET | Freq: Every day | ORAL | Status: DC
  Filled 2020-09-25: qty 1

## 2020-09-25 MED ORDER — DEXAMETHASONE SODIUM PHOSPHATE 10 MG/ML IJ SOLN
INTRAMUSCULAR | Status: AC
  Filled 2020-09-25: qty 1

## 2020-09-25 MED ORDER — ACETAMINOPHEN 500 MG PO TABS
1000.0000 mg | ORAL_TABLET | ORAL | Status: AC
  Administered 2020-09-25: 1000 mg via ORAL
  Filled 2020-09-25: qty 2

## 2020-09-25 MED ORDER — BUPROPION HCL ER (XL) 150 MG PO TB24: 300.0000 mg | ORAL_TABLET | Freq: Every day | ORAL | Status: DC

## 2020-09-25 MED ORDER — AMISULPRIDE (ANTIEMETIC) 5 MG/2ML IV SOLN
INTRAVENOUS | Status: AC
  Filled 2020-09-25: qty 4

## 2020-09-25 MED ORDER — ALUM & MAG HYDROXIDE-SIMETH 200-200-20 MG/5ML PO SUSP: 30.0000 mL | ORAL | Status: DC | PRN

## 2020-09-25 MED ORDER — ROCURONIUM BROMIDE 10 MG/ML (PF) SYRINGE
PREFILLED_SYRINGE | INTRAVENOUS | Status: DC | PRN
  Administered 2020-09-25: 30 mg via INTRAVENOUS
  Administered 2020-09-25: 50 mg via INTRAVENOUS

## 2020-09-25 MED ORDER — LIDOCAINE 2% (20 MG/ML) 5 ML SYRINGE
INTRAMUSCULAR | Status: AC
  Filled 2020-09-25: qty 5

## 2020-09-25 MED ORDER — HYDROMORPHONE HCL 1 MG/ML IJ SOLN: 0.2000 mg | INTRAMUSCULAR | Status: DC | PRN

## 2020-09-25 MED ORDER — PHENYLEPHRINE 40 MCG/ML (10ML) SYRINGE FOR IV PUSH (FOR BLOOD PRESSURE SUPPORT)
PREFILLED_SYRINGE | INTRAVENOUS | Status: DC | PRN
  Administered 2020-09-25: 40 ug via INTRAVENOUS
  Administered 2020-09-25: 80 ug via INTRAVENOUS

## 2020-09-25 MED ORDER — OXYCODONE HCL 5 MG PO TABS: 5.0000 mg | ORAL_TABLET | ORAL | Status: DC | PRN

## 2020-09-25 MED ORDER — PROMETHAZINE HCL 25 MG/ML IJ SOLN
6.2500 mg | INTRAMUSCULAR | Status: DC | PRN
  Administered 2020-09-25: 12.5 mg via INTRAVENOUS

## 2020-09-25 MED ORDER — KCL IN DEXTROSE-NACL 20-5-0.45 MEQ/L-%-% IV SOLN
INTRAVENOUS | Status: DC
  Filled 2020-09-25: qty 1000

## 2020-09-25 MED ORDER — ENOXAPARIN SODIUM 40 MG/0.4ML ~~LOC~~ SOLN
40.0000 mg | SUBCUTANEOUS | Status: AC
  Administered 2020-09-25: 40 mg via SUBCUTANEOUS
  Filled 2020-09-25: qty 0.4

## 2020-09-25 MED ORDER — MIDAZOLAM HCL 2 MG/2ML IJ SOLN
INTRAMUSCULAR | Status: DC | PRN
  Administered 2020-09-25: 2 mg via INTRAVENOUS

## 2020-09-25 MED ORDER — ROPIVACAINE HCL 5 MG/ML IJ SOLN
INTRAMUSCULAR | Status: AC
  Filled 2020-09-25: qty 30

## 2020-09-25 MED ORDER — IBUPROFEN 800 MG PO TABS: 800.0000 mg | ORAL_TABLET | Freq: Four times a day (QID) | ORAL | Status: DC

## 2020-09-25 MED ORDER — ORAL CARE MOUTH RINSE: 15.0000 mL | Freq: Once | OROMUCOSAL | Status: AC

## 2020-09-25 MED ORDER — VENLAFAXINE HCL ER 75 MG PO CP24: 225.0000 mg | ORAL_CAPSULE | Freq: Every day | ORAL | Status: DC

## 2020-09-25 MED ORDER — OXYCODONE HCL 5 MG PO TABS
5.0000 mg | ORAL_TABLET | ORAL | 0 refills | Status: DC | PRN
Start: 2020-09-25 — End: 2020-10-23

## 2020-09-25 MED ORDER — MIDAZOLAM HCL 2 MG/2ML IJ SOLN
INTRAMUSCULAR | Status: AC
  Filled 2020-09-25: qty 2

## 2020-09-25 MED ORDER — KETOROLAC TROMETHAMINE 30 MG/ML IJ SOLN
30.0000 mg | Freq: Four times a day (QID) | INTRAMUSCULAR | Status: DC
  Administered 2020-09-25 (×2): 30 mg via INTRAVENOUS
  Filled 2020-09-25 (×2): qty 1

## 2020-09-25 MED ORDER — ENOXAPARIN SODIUM 40 MG/0.4ML ~~LOC~~ SOLN: 40.0000 mg | SUBCUTANEOUS | Status: DC

## 2020-09-25 MED ORDER — IBUPROFEN 800 MG PO TABS
800.0000 mg | ORAL_TABLET | Freq: Three times a day (TID) | ORAL | 0 refills | Status: DC | PRN
Start: 2020-09-25 — End: 2021-09-19

## 2020-09-25 MED ORDER — CHLORHEXIDINE GLUCONATE 0.12 % MT SOLN
15.0000 mL | Freq: Once | OROMUCOSAL | Status: AC
  Administered 2020-09-25: 15 mL via OROMUCOSAL
  Filled 2020-09-25: qty 15

## 2020-09-25 SURGICAL SUPPLY — 62 items
APPLICATOR ARISTA FLEXITIP XL (MISCELLANEOUS) IMPLANT
CABLE HIGH FREQUENCY MONO STRZ (ELECTRODE) IMPLANT
CANISTER SUCT 3000ML PPV (MISCELLANEOUS) ×3 IMPLANT
CELL SAVER LIPIGURD (MISCELLANEOUS) IMPLANT
COVER MAYO STAND STRL (DRAPES) ×3 IMPLANT
COVER WAND RF STERILE (DRAPES) ×3 IMPLANT
DECANTER SPIKE VIAL GLASS SM (MISCELLANEOUS) ×9 IMPLANT
DERMABOND ADVANCED (GAUZE/BANDAGES/DRESSINGS) ×1
DERMABOND ADVANCED .7 DNX12 (GAUZE/BANDAGES/DRESSINGS) ×2 IMPLANT
DURAPREP 26ML APPLICATOR (WOUND CARE) ×3 IMPLANT
EXTRT SYSTEM ALEXIS 14CM (MISCELLANEOUS)
EXTRT SYSTEM ALEXIS 17CM (MISCELLANEOUS)
GLOVE BIOGEL PI IND STRL 7.0 (GLOVE) ×12 IMPLANT
GLOVE BIOGEL PI INDICATOR 7.0 (GLOVE) ×6
GLOVE ECLIPSE 6.5 STRL STRAW (GLOVE) ×15 IMPLANT
GOWN STRL REUS W/ TWL LRG LVL3 (GOWN DISPOSABLE) ×8 IMPLANT
GOWN STRL REUS W/TWL LRG LVL3 (GOWN DISPOSABLE) ×12
HARMONIC RUM II 2.5CM SILVER (DISPOSABLE) ×3
HARMONIC RUM II 3.0CM SILVER (DISPOSABLE) ×3
HARMONIC RUM II 3.5CM SILVER (DISPOSABLE)
HARMONIC RUM II 4.0CM SILVER (DISPOSABLE)
HEMOSTAT ARISTA ABSORB 3G PWDR (HEMOSTASIS) IMPLANT
HIBICLENS CHG 4% 4OZ BTL (MISCELLANEOUS) ×3 IMPLANT
KIT TURNOVER KIT B (KITS) ×3 IMPLANT
LIGASURE VESSEL 5MM BLUNT TIP (ELECTROSURGICAL) ×3 IMPLANT
NEEDLE INSUFFLATION 14GA 120MM (NEEDLE) ×3 IMPLANT
PACK LAPAROSCOPY BASIN (CUSTOM PROCEDURE TRAY) ×3 IMPLANT
PACK TRENDGUARD 450 HYBRID PRO (MISCELLANEOUS) ×2 IMPLANT
POUCH LAPAROSCOPIC INSTRUMENT (MISCELLANEOUS) ×3 IMPLANT
PROTECTOR NERVE ULNAR (MISCELLANEOUS) ×6 IMPLANT
SCALPEL HRMNC RUM II 2.5 SILVR (DISPOSABLE) ×2 IMPLANT
SCALPEL HRMNC RUM II 3.0 SILVR (DISPOSABLE) ×2 IMPLANT
SCALPEL HRMNC RUM II 3.5 SILVR (DISPOSABLE) IMPLANT
SCALPEL HRMNC RUM II 4.0 SILVR (DISPOSABLE) IMPLANT
SCISSORS LAP 5X35 DISP (ENDOMECHANICALS) IMPLANT
SET CYSTO W/LG BORE CLAMP LF (SET/KITS/TRAYS/PACK) ×3 IMPLANT
SET IRRIG TUBING LAPAROSCOPIC (IRRIGATION / IRRIGATOR) ×3 IMPLANT
SET TRI-LUMEN FLTR TB AIRSEAL (TUBING) ×3 IMPLANT
SHEARS HARMONIC ACE PLUS 36CM (ENDOMECHANICALS) ×3 IMPLANT
SUT VIC AB 0 CT1 27 (SUTURE) ×3
SUT VIC AB 0 CT1 27XBRD ANBCTR (SUTURE) ×2 IMPLANT
SUT VICRYL 0 UR6 27IN ABS (SUTURE) ×3 IMPLANT
SUT VICRYL 4-0 PS2 18IN ABS (SUTURE) ×3 IMPLANT
SUT VLOC 180 0 9IN  GS21 (SUTURE) ×3
SUT VLOC 180 0 9IN GS21 (SUTURE) ×2 IMPLANT
SYR 50ML LL SCALE MARK (SYRINGE) ×6 IMPLANT
SYSTEM CONTND EXTRCTN KII BLLN (MISCELLANEOUS) IMPLANT
TIP RUMI ORANGE 6.7MMX12CM (TIP) IMPLANT
TIP UTERINE 5.1X6CM LAV DISP (MISCELLANEOUS) IMPLANT
TIP UTERINE 6.7X10CM GRN DISP (MISCELLANEOUS) IMPLANT
TIP UTERINE 6.7X6CM WHT DISP (MISCELLANEOUS) IMPLANT
TIP UTERINE 6.7X8CM BLUE DISP (MISCELLANEOUS) ×3 IMPLANT
TOWEL GREEN STERILE FF (TOWEL DISPOSABLE) ×6 IMPLANT
TRAY FOLEY W/BAG SLVR 14FR (SET/KITS/TRAYS/PACK) ×3 IMPLANT
TRENDGUARD 450 HYBRID PRO PACK (MISCELLANEOUS) ×3
TROCAR 5M 150ML BLDLS (TROCAR) ×3 IMPLANT
TROCAR ADV FIXATION 5X100MM (TROCAR) ×3 IMPLANT
TROCAR PORT AIRSEAL 5X120 (TROCAR) ×3 IMPLANT
TROCAR XCEL NON BLADE 8MM B8LT (ENDOMECHANICALS) ×3 IMPLANT
TROCAR XCEL NON-BLD 5MMX100MML (ENDOMECHANICALS) IMPLANT
UNDERPAD 30X36 HEAVY ABSORB (UNDERPADS AND DIAPERS) ×3 IMPLANT
WARMER LAPAROSCOPE (MISCELLANEOUS) ×3 IMPLANT

## 2020-09-25 NOTE — Interval H&P Note (Signed)
History and Physical Interval Note:  09/25/2020 7:06 AM  Lauren Wilcox  has presented today for surgery, with the diagnosis of Menorrhagia with regular cycle.  The various methods of treatment have been discussed with the patient and family. After consideration of risks, benefits and other options for treatment, the patient has consented to  Procedure(s): TOTAL LAPAROSCOPIC HYSTERECTOMY WITH BILATERAL SALPINGECTOMY (N/A) CYSTOSCOPY (N/A) as a surgical intervention.  The patient's history has been reviewed, patient examined, no change in status, stable for surgery.  I have reviewed the patient's chart and labs.  Questions were answered to the patient's satisfaction.     Salvadore Dom

## 2020-09-25 NOTE — Anesthesia Postprocedure Evaluation (Signed)
Anesthesia Post Note  Patient: LASONDRA HODGKINS  Procedure(s) Performed: TOTAL LAPAROSCOPIC HYSTERECTOMY WITH BILATERAL SALPINGECTOMY (N/A Abdomen) CYSTOSCOPY (N/A Bladder)     Patient location during evaluation: PACU Anesthesia Type: General Level of consciousness: awake and alert Pain management: pain level controlled Vital Signs Assessment: post-procedure vital signs reviewed and stable Respiratory status: spontaneous breathing, nonlabored ventilation and respiratory function stable Cardiovascular status: blood pressure returned to baseline and stable Postop Assessment: no apparent nausea or vomiting Anesthetic complications: no   No complications documented.  Last Vitals:  Vitals:   09/25/20 1133 09/25/20 1153  BP: 124/86 (!) 97/54  Pulse: 92 90  Resp: 16 17  Temp: 36.7 C 36.6 C  SpO2: 98% 100%    Last Pain:  Vitals:   09/25/20 1153  TempSrc: Oral  PainSc:                  Audry Pili

## 2020-09-25 NOTE — Anesthesia Procedure Notes (Signed)
Procedure Name: Intubation Date/Time: 09/25/2020 7:39 AM Performed by: Michele Rockers, CRNA Pre-anesthesia Checklist: Patient identified, Patient being monitored, Timeout performed, Emergency Drugs available and Suction available Patient Re-evaluated:Patient Re-evaluated prior to induction Oxygen Delivery Method: Circle System Utilized Preoxygenation: Pre-oxygenation with 100% oxygen Induction Type: IV induction Ventilation: Mask ventilation without difficulty Laryngoscope Size: Miller and 2 Grade View: Grade I Tube type: Oral Tube size: 7.5 mm Number of attempts: 1 Airway Equipment and Method: Stylet Placement Confirmation: ETT inserted through vocal cords under direct vision,  positive ETCO2 and breath sounds checked- equal and bilateral Secured at: 21 cm Tube secured with: Tape Dental Injury: Teeth and Oropharynx as per pre-operative assessment

## 2020-09-25 NOTE — Discharge Summary (Signed)
Physician Discharge Summary  Patient ID: Lauren Wilcox MRN: [id number] DOB/AGE: 1976-01-24 44 y.o.  Admit date: 09/25/2020 Discharge date: 09/25/2020  Admission Diagnoses:  Discharge Diagnoses:  Active Problems:   S/P laparoscopic hysterectomy   Discharged Condition: good  Hospital Course: Uncomplicated  Consults: None  Significant Diagnostic Studies: labs:  Lab Results  Component Value Date   WBC 9.1 09/25/2020   HGB 12.8 09/25/2020   HCT 40.7 09/25/2020   MCV 79.2 (L) 09/25/2020   PLT 250 09/25/2020     Treatments: surgery: Total laparoscopic hysterectomy, bilateral salpingectomies  Discharge Exam: Blood pressure 125/83, pulse 88, temperature 98.9 F (37.2 C), temperature source Oral, resp. rate 17, height 5\' 4"  (1.626 m), weight 134 kg, SpO2 98 %. General appearance: alert, cooperative and no distress Resp: clear to auscultation bilaterally Cardio: S1, S2 normal GI: soft, non-tender; bowel sounds normal; no masses,  no organomegaly Extremities: extremities normal, atraumatic, no cyanosis or edema Incision/Wound: clean, dry and intact without erythema  Disposition: Discharge disposition: 01-Home or Self Care       Discharge Instructions    Call MD for:   Complete by: As directed    Heavy vaginal bleeding   Call MD for:  difficulty breathing, headache or visual disturbances   Complete by: As directed    Call MD for:  extreme fatigue   Complete by: As directed    Call MD for:  hives   Complete by: As directed    Call MD for:  persistant dizziness or light-headedness   Complete by: As directed    Call MD for:  persistant nausea and vomiting   Complete by: As directed    Call MD for:  redness, tenderness, or signs of infection (pain, swelling, redness, odor or green/yellow discharge around incision site)   Complete by: As directed    Call MD for:  severe uncontrolled pain   Complete by: As directed    Call MD for:  temperature >100.4   Complete by:  As directed    Diet - low sodium heart healthy   Complete by: As directed    Driving Restrictions   Complete by: As directed    No driving while taking oxycodone or until you can slam on the brakes of the car   Increase activity slowly   Complete by: As directed    May shower / Bathe   Complete by: As directed    May shower 24 hours after surgery   No dressing needed   Complete by: As directed    Sexual Activity Restrictions   Complete by: As directed    No intercourse for 12 weeks     Allergies as of 09/25/2020      Reactions   Sulfa Antibiotics Other (See Comments)   unknown      Medication List    TAKE these medications   acetaminophen 500 MG tablet Commonly known as: TYLENOL Take 2 tablets (1,000 mg total) by mouth every 6 (six) hours.   ALPRAZolam 0.25 MG tablet Commonly known as: XANAX Take 0.125-0.25 mg by mouth 2 (two) times daily as needed for anxiety.   buPROPion 300 MG 24 hr tablet Commonly known as: WELLBUTRIN XL Take 300 mg by mouth daily.   cetirizine 10 MG tablet Commonly known as: ZYRTEC Take 10 mg by mouth daily.   docusate sodium 100 MG capsule Commonly known as: COLACE Take 1 capsule (100 mg total) by mouth 2 (two) times daily.   ibuprofen 800 MG tablet  Commonly known as: ADVIL Take 1 tablet (800 mg total) by mouth every 8 (eight) hours as needed.   metFORMIN 500 MG 24 hr tablet Commonly known as: GLUCOPHAGE-XR Take 500 mg by mouth daily with breakfast. For weight loss.   oxyCODONE 5 MG immediate release tablet Commonly known as: Oxy IR/ROXICODONE Take 1-2 tablets (5-10 mg total) by mouth every 4 (four) hours as needed for moderate pain.   pantoprazole 40 MG tablet Commonly known as: PROTONIX Take 40 mg by mouth daily.   pravastatin 40 MG tablet Commonly known as: PRAVACHOL Take 40 mg by mouth daily.   rOPINIRole 2 MG tablet Commonly known as: REQUIP Take 2 mg by mouth at bedtime.   SUMAtriptan 100 MG tablet Commonly known  as: IMITREX Take 100 mg by mouth every 2 (two) hours as needed for migraine. May repeat in 2 hours if headache persists or recurs.   topiramate 50 MG tablet Commonly known as: TOPAMAX Take 50 mg by mouth daily.   venlafaxine XR 75 MG 24 hr capsule Commonly known as: EFFEXOR-XR Take 225 mg by mouth daily.            Discharge Care Instructions  (From admission, onward)         Start     Ordered   09/25/20 0000  No dressing needed        09/25/20 1824           Signed: Salvadore Dom 09/25/2020, 6:27 PM

## 2020-09-25 NOTE — Transfer of Care (Signed)
Immediate Anesthesia Transfer of Care Note  Patient: ANAHLI ARVANITIS  Procedure(s) Performed: TOTAL LAPAROSCOPIC HYSTERECTOMY WITH BILATERAL SALPINGECTOMY (N/A Abdomen) CYSTOSCOPY (N/A Bladder)  Patient Location: PACU  Anesthesia Type:General  Level of Consciousness: drowsy, patient cooperative and responds to stimulation  Airway & Oxygen Therapy: Patient Spontanous Breathing and Patient connected to face mask oxygen  Post-op Assessment: Report given to RN, Post -op Vital signs reviewed and stable and Patient moving all extremities X 4  Post vital signs: Reviewed and stable  Last Vitals:  Vitals Value Taken Time  BP 123/87 09/25/20 1033  Temp    Pulse 109 09/25/20 1035  Resp 19 09/25/20 1035  SpO2 99 % 09/25/20 1035  Vitals shown include unvalidated device data.  Last Pain:  Vitals:   09/25/20 0653  TempSrc: Oral  PainSc:          Complications: No complications documented.

## 2020-09-25 NOTE — Progress Notes (Signed)
Patient discharged to home accompanied by husband via private car.  Discharged packet and instruction given to patient and patient verbalized understanding.

## 2020-09-25 NOTE — Op Note (Signed)
Preoperative Diagnosis: menorrhagia  Postoperative Diagnosis: same  Procedure:  Total Laparoscopic Hysterectomy with bilateral salpingectomies and cystoscopy  Surgeon: Dr Sumner Boast  Assistant: Dr Josefa Half, an MD assistant was necessary for tissue manipulation, retraction and positioning due to the complexity of the case and hospital policies  Anesthesia: General  EBL: 50   Fluids: 1,100  Urine output: 235  Complications: none  Indications for surgery: The patient is a 44 year old female, who presented with menorrhagia which persisted on OCP's and then POP's. Work up included a normal TSH, normal hgb, normal ultrasound, benign endometrial biopsy and a normal pap. The patient desires definitive treatment.  The patient is aware of the risks and complications involved with the surgery and consent was obtained prior to the procedure.  Findings: EUA: mobile normal sized uterus. Laparoscopy: normal uterus and right ovary, there was some erythematous tissue on the left ovary that was biopsied, evidence of bilateral tubal ligation. The filshe clip on the left was attached to the side of the left uterus, the other filsche clip was on the edge of the omentum (both clips were removed)  Procedure: The patient was taken to the operating room with an IV in placed, preoperative antibiotics had been administered. She was placed in the dorsal lithotomy position. General anesthesia was administered. She was prepped and draped in the usual sterile fashion for an abdominal, vaginal surgery. A rumi uterine manipulator was placed, using a # 3 cup and a 8 cm extender. A foley catheter was placed.    The umbilicus was everted, injected with 0.25% marcaine and incised with a # 11 blade. 2 towel clips were used to elevated the umbilicus and a veress needle was placed into the abdominal cavity. The abdominal cavity was insufflated with CO2, with normal intraabdominal pressures. After adequate  pneumo-insufflation the veress needle was removed and the 5 mm laparoscope was placed into the abdominal cavity using the opti-view trocar. The patient was placed in trendelenburg and the abdominal pelvic cavity was inspected. 3 more trocars were placed: 1 in each lower quadrant approximately 3 cm medial to and superior to the anterior superior iliac spine and one in the midline approximately 6 cm above the pubic symphysis in the midline. These areas were injected with 0.25% marcaine, incised with a #11 blade and all trocars were inserted with direct visualization with the laparoscope. A # 5 airseal trocar was placed in the RLQ, a 5 mm trocar in the LLQ and a #8 trocar in the midline. The abdominal pelvic cavity was again inspected. A mixture of 30 cc of Robivacaine and 30 cc of NS was place in the pelvic cavity. The ureters were identified bilaterally.  The left tube was elevated from the pelvic sidewall, cauterized and cut with the ligasure device. The mesosalpinx was cauterized and cut with the ligasure device.  The tube was separated from the uterus using the ligasure device and removed through the midline trocar. The left uterine ovarian ligament was cauterized and cut with the ligasure device. The left round ligament was cauterized and cut with the ligasure device and the anterior and posterior leafs of the broad ligament were taken down with the ligasure device. The harmonic scalpel was then used to take down the bladder flap and skeltonize the vessels. The left uterine vessels were then clamped, cauterized and ligated with the ligasure device. Hemostasis was excellent. The same procedure was repeated on the right.   Using the rumi manipulator the uterus was pushed up  in the pelvic cavity and the harmonic scalpel was used to separate the cervix from the vagina using the harmonic energy. The uterus was  removed vaginally at this time. An occluder was placed in the vagina to maintain pneumoperitoneum. The  vaginal cuff was then closed with a 0 V-lock suture. Hemostasis was excellent. The abdominal pelvic cavity was irrigated and suctioned dry. Pressure was released and hemostasis remained excellent.   The abdominal cavity was desufflated and the trocars were removed. The skin was closed with subcuticular stiches of 4-0 vicryl and dermabond was placed over the incisions.  The foley catheter was removed and cystoscopy was performed using a 70 degree scope. Both ureters expelled urine, no bladder abnormalities were noted. The bladder was allowed to drain and the cystoscope was removed.   The patient's abdomen and perineum were cleansed and she was taken out of the dorsal lithotomy position. Upon awakening she was extubated and taken to the recovery room in stable condition. The sponge and instrument counts were correct.

## 2020-09-26 ENCOUNTER — Encounter (HOSPITAL_COMMUNITY): Payer: Self-pay | Admitting: Obstetrics and Gynecology

## 2020-09-26 LAB — SURGICAL PATHOLOGY

## 2020-10-02 ENCOUNTER — Ambulatory Visit (INDEPENDENT_AMBULATORY_CARE_PROVIDER_SITE_OTHER): Payer: 59 | Admitting: Obstetrics and Gynecology

## 2020-10-02 ENCOUNTER — Encounter: Payer: Self-pay | Admitting: Obstetrics and Gynecology

## 2020-10-02 ENCOUNTER — Other Ambulatory Visit: Payer: Self-pay

## 2020-10-02 VITALS — BP 128/74 | HR 64 | Resp 14 | Ht 64.0 in | Wt 297.0 lb

## 2020-10-02 DIAGNOSIS — Z90710 Acquired absence of both cervix and uterus: Secondary | ICD-10-CM

## 2020-10-02 NOTE — Progress Notes (Signed)
GYNECOLOGY  VISIT   HPI: 44 y.o.   Married White or Caucasian Not Hispanic or Latino  female   [phone] with Patient's last menstrual period was 08/13/2020.   here for  1 week post op s/p TLH/BS. Pt denies any concerns today - said that she is doing well. Only had to take 1 of her pain pills the night she came home. She is having normal bowel and bladder function. Only taking one advil a day.   GYNECOLOGIC HISTORY: Patient's last menstrual period was 08/13/2020. Contraception:Status post hysterectomy  Menopausal hormone therapy: NA         OB History    Gravida  2   Para  1   Term  1   Preterm      AB  1   Living  1     SAB  1   TAB      Ectopic      Multiple      Live Births                 Patient Active Problem List   Diagnosis Date Noted  . S/P laparoscopic hysterectomy 09/25/2020  . Hx of migraines 02/24/2020  . Elevated BP without diagnosis of hypertension 02/24/2020  . BMI 50.0-59.9, adult ([place]) 02/24/2020  . Menorrhagia with irregular cycle 02/24/2020    Past Medical History:  Diagnosis Date  . Anxiety   . Depression   . GERD (gastroesophageal reflux disease)   . Hypercholesterolemia   . IBS (irritable bowel syndrome)   . Migraine without aura   . Restless leg syndrome     Past Surgical History:  Procedure Laterality Date  . CHOLECYSTECTOMY    . CHOLECYSTECTOMY, LAPAROSCOPIC     pt.states complication--"sleepy colon and kidney"  . CYSTOSCOPY N/A 09/25/2020   Procedure: CYSTOSCOPY;  Surgeon: Salvadore Dom, MD;  Location: [facility];  Service: Gynecology;  Laterality: N/A;  . DERMOID CYST  EXCISION  2010  . TONSILLECTOMY AND ADENOIDECTOMY     age 73  . TOTAL LAPAROSCOPIC HYSTERECTOMY WITH SALPINGECTOMY N/A 09/25/2020   Procedure: TOTAL LAPAROSCOPIC HYSTERECTOMY WITH BILATERAL SALPINGECTOMY;  Surgeon: Salvadore Dom, MD;  Location: [facility];  Service: Gynecology;  Laterality: N/A;  . TUBAL LIGATION    . WISDOM TOOTH EXTRACTION   11/10/2004    Current Outpatient Medications  Medication Sig Dispense Refill  . acetaminophen (TYLENOL) 500 MG tablet Take 2 tablets (1,000 mg total) by mouth every 6 (six) hours. 30 tablet 0  . ALPRAZolam (XANAX) 0.25 MG tablet Take 0.125-0.25 mg by mouth 2 (two) times daily as needed for anxiety.     Marland Kitchen buPROPion (WELLBUTRIN XL) 300 MG 24 hr tablet Take 300 mg by mouth daily.    . cetirizine (ZYRTEC) 10 MG tablet Take 10 mg by mouth daily.     Marland Kitchen docusate sodium (COLACE) 100 MG capsule Take 1 capsule (100 mg total) by mouth 2 (two) times daily. 30 capsule 0  . ibuprofen (ADVIL) 800 MG tablet Take 1 tablet (800 mg total) by mouth every 8 (eight) hours as needed. 30 tablet 0  . pantoprazole (PROTONIX) 40 MG tablet Take 40 mg by mouth daily.    . pravastatin (PRAVACHOL) 40 MG tablet Take 40 mg by mouth daily.    Marland Kitchen rOPINIRole (REQUIP) 2 MG tablet Take 2 mg by mouth at bedtime.    . SUMAtriptan (IMITREX) 100 MG tablet Take 100 mg by mouth every 2 (two) hours as needed for migraine.  May repeat in 2 hours if headache persists or recurs.    . topiramate (TOPAMAX) 50 MG tablet Take 50 mg by mouth daily.    Marland Kitchen venlafaxine XR (EFFEXOR-XR) 75 MG 24 hr capsule Take 225 mg by mouth daily.    . metFORMIN (GLUCOPHAGE-XR) 500 MG 24 hr tablet Take 500 mg by mouth daily with breakfast. For weight loss.  (Patient not taking: Reported on 10/02/2020)    . oxyCODONE (OXY IR/ROXICODONE) 5 MG immediate release tablet Take 1-2 tablets (5-10 mg total) by mouth every 4 (four) hours as needed for moderate pain. (Patient not taking: Reported on 10/02/2020) 20 tablet 0   No current facility-administered medications for this visit.     ALLERGIES: Sulfa antibiotics  Family History  Problem Relation Age of Onset  . Heart attack Father 26       Dec  . Kidney disease Brother   . Cancer Maternal Grandmother 75       Dec from colon ca  . Breast cancer Paternal Grandmother 91  . Cancer Paternal Grandfather 82       Dec  from colon ca    Social History   Socioeconomic History  . Marital status: Married    Spouse name: Not on file  . Number of children: Not on file  . Years of education: Not on file  . Highest education level: Not on file  Occupational History  . Not on file  Tobacco Use  . Smoking status: Never Smoker  . Smokeless tobacco: Never Used  Vaping Use  . Vaping Use: Never used  Substance and Sexual Activity  . Alcohol use: Not Currently  . Drug use: Never  . Sexual activity: Yes    Birth control/protection: Pill    Comment: Micronor  Other Topics Concern  . Not on file  Social History Narrative  . Not on file   Social Determinants of Health   Financial Resource Strain:   . Difficulty of Paying Living Expenses: Not on file  Food Insecurity:   . Worried About Charity fundraiser in the Last Year: Not on file  . Ran Out of Food in the Last Year: Not on file  Transportation Needs:   . Lack of Transportation (Medical): Not on file  . Lack of Transportation (Non-Medical): Not on file  Physical Activity:   . Days of Exercise per Week: Not on file  . Minutes of Exercise per Session: Not on file  Stress:   . Feeling of Stress : Not on file  Social Connections:   . Frequency of Communication with Friends and Family: Not on file  . Frequency of Social Gatherings with Friends and Family: Not on file  . Attends Religious Services: Not on file  . Active Member of Clubs or Organizations: Not on file  . Attends Archivist Meetings: Not on file  . Marital Status: Not on file  Intimate Partner Violence:   . Fear of Current or Ex-Partner: Not on file  . Emotionally Abused: Not on file  . Physically Abused: Not on file  . Sexually Abused: Not on file    ROS  PHYSICAL EXAMINATION:    BP 128/74   Pulse 64   Resp 14   Ht 5\' 4"  (1.626 m)   Wt 297 lb (134.7 kg)   LMP 08/13/2020   SpO2 99%   BMI 50.98 kg/m     General appearance: alert, cooperative and appears stated  age Abdomen: soft, non-tender; non distended,  no masses,  no organomegaly. Incisions healing well, ecchymosis around RLQ    ASSESSMENT S/p TLH/BS, doing well. Benign pathology    PLAN F/U in 3 weeks Call with any concerns

## 2020-10-23 ENCOUNTER — Ambulatory Visit (INDEPENDENT_AMBULATORY_CARE_PROVIDER_SITE_OTHER): Payer: 59 | Admitting: Obstetrics and Gynecology

## 2020-10-23 ENCOUNTER — Other Ambulatory Visit: Payer: Self-pay

## 2020-10-23 ENCOUNTER — Encounter: Payer: Self-pay | Admitting: Obstetrics and Gynecology

## 2020-10-23 VITALS — BP 130/74 | HR 98 | Ht 64.0 in | Wt 295.2 lb

## 2020-10-23 DIAGNOSIS — Z90710 Acquired absence of both cervix and uterus: Secondary | ICD-10-CM

## 2020-10-23 NOTE — Progress Notes (Signed)
GYNECOLOGY  VISIT   HPI: 44 y.o.   Married White or Caucasian Not Hispanic or Latino  female   [phone] with Patient's last menstrual period was 08/13/2020.   here for 4 week post op hysterectomy. Patient states that she is feeling great. She has not taken her Ibuprofen.   She is s/p TLH/BS/cystoscopy on 09/25/20 for menorrhagia. Pathology was benign.  Doing well. She has mild constipation if she doesn't drink enough water.  She is doing kegels for her mixed incontinence. Very little leakage. No vaginal bleeding.   GYNECOLOGIC HISTORY: Patient's last menstrual period was 08/13/2020. Contraception: post hysterectomy  Menopausal hormone therapy: none         OB History    Gravida  2   Para  1   Term  1   Preterm      AB  1   Living  1     SAB  1   IAB      Ectopic      Multiple      Live Births                 Patient Active Problem List   Diagnosis Date Noted  . S/P laparoscopic hysterectomy 09/25/2020  . Hx of migraines 02/24/2020  . Elevated BP without diagnosis of hypertension 02/24/2020  . BMI 50.0-59.9, adult ([facility]) 02/24/2020  . Menorrhagia with irregular cycle 02/24/2020    Past Medical History:  Diagnosis Date  . Anxiety   . Depression   . GERD (gastroesophageal reflux disease)   . Hypercholesterolemia   . IBS (irritable bowel syndrome)   . Migraine without aura   . Restless leg syndrome     Past Surgical History:  Procedure Laterality Date  . CHOLECYSTECTOMY    . CHOLECYSTECTOMY, LAPAROSCOPIC     pt.states complication--"sleepy colon and kidney"  . CYSTOSCOPY N/A 09/25/2020   Procedure: CYSTOSCOPY;  Surgeon: Salvadore Dom, MD;  Location: [facility];  Service: Gynecology;  Laterality: N/A;  . DERMOID CYST  EXCISION  2010  . TONSILLECTOMY AND ADENOIDECTOMY     age 34  . TOTAL LAPAROSCOPIC HYSTERECTOMY WITH SALPINGECTOMY N/A 09/25/2020   Procedure: TOTAL LAPAROSCOPIC HYSTERECTOMY WITH BILATERAL SALPINGECTOMY;  Surgeon: Salvadore Dom, MD;  Location: [facility];  Service: Gynecology;  Laterality: N/A;  . TUBAL LIGATION    . WISDOM TOOTH EXTRACTION  11/10/2004    Current Outpatient Medications  Medication Sig Dispense Refill  . acetaminophen (TYLENOL) 500 MG tablet Take 2 tablets (1,000 mg total) by mouth every 6 (six) hours. 30 tablet 0  . ALPRAZolam (XANAX) 0.25 MG tablet Take 0.125-0.25 mg by mouth 2 (two) times daily as needed for anxiety.     Marland Kitchen buPROPion (WELLBUTRIN XL) 300 MG 24 hr tablet Take 300 mg by mouth daily.    . cetirizine (ZYRTEC) 10 MG tablet Take 10 mg by mouth daily.     Marland Kitchen docusate sodium (COLACE) 100 MG capsule Take 1 capsule (100 mg total) by mouth 2 (two) times daily. 30 capsule 0  . ibuprofen (ADVIL) 800 MG tablet Take 1 tablet (800 mg total) by mouth every 8 (eight) hours as needed. 30 tablet 0  . metFORMIN (GLUCOPHAGE-XR) 500 MG 24 hr tablet Take 500 mg by mouth daily with breakfast. For weight loss.  (Patient not taking: Reported on 10/02/2020)    . oxyCODONE (OXY IR/ROXICODONE) 5 MG immediate release tablet Take 1-2 tablets (5-10 mg total) by mouth every 4 (four) hours as needed for  moderate pain. (Patient not taking: Reported on 10/02/2020) 20 tablet 0  . pantoprazole (PROTONIX) 40 MG tablet Take 40 mg by mouth daily.    . pravastatin (PRAVACHOL) 40 MG tablet Take 40 mg by mouth daily.    Marland Kitchen rOPINIRole (REQUIP) 2 MG tablet Take 2 mg by mouth at bedtime.    . SUMAtriptan (IMITREX) 100 MG tablet Take 100 mg by mouth every 2 (two) hours as needed for migraine. May repeat in 2 hours if headache persists or recurs.    . topiramate (TOPAMAX) 50 MG tablet Take 50 mg by mouth daily.    Marland Kitchen venlafaxine XR (EFFEXOR-XR) 75 MG 24 hr capsule Take 225 mg by mouth daily.     No current facility-administered medications for this visit.     ALLERGIES: Sulfa antibiotics  Family History  Problem Relation Age of Onset  . Heart attack Father 76       Dec  . Kidney disease Brother   . Cancer Maternal  Grandmother 75       Dec from colon ca  . Breast cancer Paternal Grandmother 105  . Cancer Paternal Grandfather 82       Dec from colon ca    Social History   Socioeconomic History  . Marital status: Married    Spouse name: Not on file  . Number of children: Not on file  . Years of education: Not on file  . Highest education level: Not on file  Occupational History  . Not on file  Tobacco Use  . Smoking status: Never Smoker  . Smokeless tobacco: Never Used  Vaping Use  . Vaping Use: Never used  Substance and Sexual Activity  . Alcohol use: Not Currently  . Drug use: Never  . Sexual activity: Yes    Birth control/protection: Pill    Comment: Micronor  Other Topics Concern  . Not on file  Social History Narrative  . Not on file   Social Determinants of Health   Financial Resource Strain: Not on file  Food Insecurity: Not on file  Transportation Needs: Not on file  Physical Activity: Not on file  Stress: Not on file  Social Connections: Not on file  Intimate Partner Violence: Not on file    Review of Systems  All other systems reviewed and are negative.   PHYSICAL EXAMINATION:    LMP 08/13/2020     General appearance: alert, cooperative and appears stated age Abdomen: soft, non-tender; non distended, no masses,  no organomegaly Incisions: well healed  Pelvic: External genitalia:  no lesions              Urethra:  normal appearing urethra with no masses, tenderness or lesions              Bartholins and Skenes: normal                 Vagina: normal appearing vagina with normal color and discharge, no lesions. Cuff healing well, not tender. Small amount of blood in the vagina with speculum exam, no granulation tissue seen.               Cervix: absent              Bimanual Exam:  Uterus:  uterus absent              Adnexa: no mass, fullness, tenderness                Chaperone was present for exam.  ASSESSMENT 4 weeks s/p TLH/BS, doing great     PLAN F/U for annual exam in 5/22 Pelvic rest for 2 more months She can return to work

## 2021-03-18 NOTE — Progress Notes (Deleted)
45 y.o. G93P1011 Married White or Caucasian Not Hispanic or Latino female here for annual exam.      Patient's last menstrual period was 08/13/2020.          Sexually active: {yes no:314532}  The current method of family planning is status post hysterectomy.    Exercising: {yes no:314532}  {types:19826} Smoker:  {YES NO:22349}  Health Maintenance: Pap:03/15/20  WNL Hr HPV Neg, 2018 normal per patient, 08-06-15 Neg  History of abnormal Pap:  no MMG:  *** BMD:   NA Colonoscopy: none  TDaP:  *** Gardasil: ***   reports that she has never smoked. She has never used smokeless tobacco. She reports previous alcohol use. She reports that she does not use drugs.  Past Medical History:  Diagnosis Date  . Anxiety   . Depression   . GERD (gastroesophageal reflux disease)   . Hypercholesterolemia   . IBS (irritable bowel syndrome)   . Migraine without aura   . Restless leg syndrome     Past Surgical History:  Procedure Laterality Date  . CHOLECYSTECTOMY    . CHOLECYSTECTOMY, LAPAROSCOPIC     pt.states complication--"sleepy colon and kidney"  . CYSTOSCOPY N/A 09/25/2020   Procedure: CYSTOSCOPY;  Surgeon: Salvadore Dom, MD;  Location: [facility];  Service: Gynecology;  Laterality: N/A;  . DERMOID CYST  EXCISION  2010  . TONSILLECTOMY AND ADENOIDECTOMY     age 45  . TOTAL LAPAROSCOPIC HYSTERECTOMY WITH SALPINGECTOMY N/A 09/25/2020   Procedure: TOTAL LAPAROSCOPIC HYSTERECTOMY WITH BILATERAL SALPINGECTOMY;  Surgeon: Salvadore Dom, MD;  Location: [facility];  Service: Gynecology;  Laterality: N/A;  . TUBAL LIGATION    . WISDOM TOOTH EXTRACTION  11/10/2004    Current Outpatient Medications  Medication Sig Dispense Refill  . acetaminophen (TYLENOL) 500 MG tablet Take 2 tablets (1,000 mg total) by mouth every 6 (six) hours. 30 tablet 0  . ALPRAZolam (XANAX) 0.25 MG tablet Take 0.125-0.25 mg by mouth 2 (two) times daily as needed for anxiety.     Marland Kitchen buPROPion (WELLBUTRIN XL) 300 MG 24 hr  tablet Take 300 mg by mouth daily.    . cetirizine (ZYRTEC) 10 MG tablet Take 10 mg by mouth daily.     Marland Kitchen docusate sodium (COLACE) 100 MG capsule Take 1 capsule (100 mg total) by mouth 2 (two) times daily. 30 capsule 0  . ibuprofen (ADVIL) 800 MG tablet Take 1 tablet (800 mg total) by mouth every 8 (eight) hours as needed. 30 tablet 0  . pantoprazole (PROTONIX) 40 MG tablet Take 40 mg by mouth daily.    . pravastatin (PRAVACHOL) 40 MG tablet Take 40 mg by mouth daily.    Marland Kitchen rOPINIRole (REQUIP) 2 MG tablet Take 2 mg by mouth at bedtime.    . SUMAtriptan (IMITREX) 100 MG tablet Take 100 mg by mouth every 2 (two) hours as needed for migraine. May repeat in 2 hours if headache persists or recurs.    . topiramate (TOPAMAX) 50 MG tablet Take 50 mg by mouth daily.    Marland Kitchen venlafaxine XR (EFFEXOR-XR) 75 MG 24 hr capsule Take 225 mg by mouth daily.     No current facility-administered medications for this visit.    Family History  Problem Relation Age of Onset  . Heart attack Father 17       Dec  . Kidney disease Brother   . Cancer Maternal Grandmother 75       Dec from colon ca  . Breast cancer Paternal  Grandmother 46  . Cancer Paternal Grandfather 82       Dec from colon ca    Review of Systems  Exam:   LMP 08/13/2020   Weight change: @WEIGHTCHANGE @ Height:      Ht Readings from Last 3 Encounters:  10/23/20 5\' 4"  (1.626 m)  10/02/20 5\' 4"  (1.626 m)  09/25/20 5\' 4"  (1.626 m)    General appearance: alert, cooperative and appears stated age Head: Normocephalic, without obvious abnormality, atraumatic Neck: no adenopathy, supple, symmetrical, trachea midline and thyroid {CHL AMB PHY EX THYROID NORM DEFAULT:[id number]::"normal to inspection and palpation"} Lungs: clear to auscultation bilaterally Cardiovascular: regular rate and rhythm Breasts: {Exam; breast:13139::"normal appearance, no masses or tenderness"} Abdomen: soft, non-tender; non distended,  no masses,  no  organomegaly Extremities: extremities normal, atraumatic, no cyanosis or edema Skin: Skin color, texture, turgor normal. No rashes or lesions Lymph nodes: Cervical, supraclavicular, and axillary nodes normal. No abnormal inguinal nodes palpated Neurologic: Grossly normal   Pelvic: External genitalia:  no lesions              Urethra:  normal appearing urethra with no masses, tenderness or lesions              Bartholins and Skenes: normal                 Vagina: normal appearing vagina with normal color and discharge, no lesions              Cervix: {CHL AMB PHY EX CERVIX NORM DEFAULT:[phone]::"no lesions"}               Bimanual Exam:  Uterus:  {CHL AMB PHY EX UTERUS NORM DEFAULT:[id number]::"normal size, contour, position, consistency, mobility, non-tender"}              Adnexa: {CHL AMB PHY EX ADNEXA NO MASS DEFAULT:[id number]::"no mass, fullness, tenderness"}               Rectovaginal: Confirms               Anus:  normal sphincter tone, no lesions  *** chaperoned for the exam.  A:  Well Woman with normal exam  P:

## 2021-03-20 ENCOUNTER — Ambulatory Visit: Payer: 59 | Admitting: Obstetrics and Gynecology

## 2021-04-12 DIAGNOSIS — K219 Gastro-esophageal reflux disease without esophagitis: Secondary | ICD-10-CM | POA: Insufficient documentation

## 2021-09-17 NOTE — Progress Notes (Signed)
45 y.o. G48P1011 Married White or Caucasian Not Hispanic or Latino female here for annual exam.  H/O THL/BS in 11/21 for menorrhagia.  She is going to see an Endocrinologist for her Thyroid disorder. She is on a very low dose of synthroid.    Labs with primary.  She is exercising at least 3 days a week. Eating less, less carbs, more fruits and veggies.     Patient's last menstrual period was 08/13/2020.          Sexually active: Yes.    The current method of family planning is status post hysterectomy.    Exercising: Yes.     Elliptical 3x times Smoker:  no  Health Maintenance: Pap:  03/15/20- WNL, HPV- neg, 08/06/15- WNL History of abnormal Pap:  no MMG: last done at age 33  BMD:   n/a Colonoscopy: n/a TDaP:  unsure Gardasil: no   reports that she has never smoked. She has never used smokeless tobacco. She reports that she does not currently use alcohol. She reports that she does not use drugs. 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 Son is 58, at home, senior at [employer]. Working in Designer, television/film set.   Past Medical History:  Diagnosis Date   Anxiety    Depression    GERD (gastroesophageal reflux disease)    Hypercholesterolemia    IBS (irritable bowel syndrome)    Migraine without aura    Restless leg syndrome     Past Surgical History:  Procedure Laterality Date   CHOLECYSTECTOMY     CHOLECYSTECTOMY, LAPAROSCOPIC     pt.states complication--"sleepy colon and kidney"   CYSTOSCOPY N/A 09/25/2020   Procedure: CYSTOSCOPY;  Surgeon: Salvadore Dom, MD;  Location: [facility];  Service: Gynecology;  Laterality: N/A;   DERMOID CYST  EXCISION  2010   TONSILLECTOMY AND ADENOIDECTOMY     age 8   TOTAL LAPAROSCOPIC HYSTERECTOMY WITH SALPINGECTOMY N/A 09/25/2020   Procedure: TOTAL LAPAROSCOPIC HYSTERECTOMY WITH BILATERAL SALPINGECTOMY;  Surgeon: Salvadore Dom, MD;  Location: [facility];  Service: Gynecology;  Laterality: N/A;   TUBAL LIGATION     WISDOM TOOTH  EXTRACTION  11/10/2004    Current Outpatient Medications  Medication Sig Dispense Refill   ALPRAZolam (XANAX) 0.25 MG tablet Take 0.125-0.25 mg by mouth 2 (two) times daily as needed for anxiety.      buPROPion (WELLBUTRIN XL) 300 MG 24 hr tablet Take 300 mg by mouth daily.     cetirizine (ZYRTEC) 10 MG tablet Take 10 mg by mouth daily.      eletriptan (RELPAX) 40 MG tablet Take 40 mg by mouth as needed for migraine or headache. May repeat in 2 hours if headache persists or recurs.     levothyroxine (SYNTHROID) 25 MCG tablet Take 25 mcg by mouth daily.     pantoprazole (PROTONIX) 40 MG tablet Take 40 mg by mouth daily.     pravastatin (PRAVACHOL) 40 MG tablet Take 40 mg by mouth daily.     rizatriptan (MAXALT) 10 MG tablet Take by mouth.     rOPINIRole (REQUIP) 2 MG tablet Take 2 mg by mouth at bedtime.     topiramate (TOPAMAX) 50 MG tablet Take 50 mg by mouth daily.     venlafaxine XR (EFFEXOR-XR) 75 MG 24 hr capsule Take 225 mg by mouth daily.     No current facility-administered medications for this visit.    Family History  Problem Relation Age of Onset   Heart attack  Father 29       Dec   Kidney disease Brother    Cancer Maternal Grandmother 75       Dec from colon ca   Breast cancer Paternal Grandmother 15   Cancer Paternal Grandfather 82       Dec from colon ca    Review of Systems  All other systems reviewed and are negative.  Exam:   BP (!) 138/96 (BP Location: Left Arm, Patient Position: Sitting, Cuff Size: Large)   Pulse 94   Resp (!) 22   Ht 5\' 4"  (1.626 m)   Wt 296 lb (134.3 kg)   LMP 08/13/2020   BMI 50.81 kg/m   Weight change: @WEIGHTCHANGE @ Height:   Height: 5\' 4"  (162.6 cm)  Ht Readings from Last 3 Encounters:  09/19/21 5\' 4"  (1.626 m)  10/23/20 5\' 4"  (1.626 m)  10/02/20 5\' 4"  (1.626 m)    General appearance: alert, cooperative and appears stated age Head: Normocephalic, without obvious abnormality, atraumatic Neck: no adenopathy, supple,  symmetrical, trachea midline and thyroid normal to inspection and palpation Lungs: clear to auscultation bilaterally Cardiovascular: regular rate and rhythm Breasts: normal appearance, no masses or tenderness Abdomen: soft, non-tender; non distended,  no masses,  no organomegaly Extremities: extremities normal, atraumatic, no cyanosis or edema Skin: Skin color, texture, turgor normal. Erythematous rash under bilateral breasts.  Lymph nodes: Cervical, supraclavicular, and axillary nodes normal. No abnormal inguinal nodes palpated Neurologic: Grossly normal   Pelvic: External genitalia:  no lesions              Urethra:  normal appearing urethra with no masses, tenderness or lesions              Bartholins and Skenes: normal                 Vagina: normal appearing vagina with normal color and discharge, no lesions              Cervix: absent               Bimanual Exam:  Uterus:  uterus absent              Adnexa: no mass, fullness, tenderness               Rectovaginal: Confirms               Anus:  normal sphincter tone, no lesions    1. Well woman exam Discussed breast self exam Discussed calcium and vit D intake Mammogram overdue, # given Labs with primary Colon cancer screening next year  2. Candidal intertrigo Under bilateral breasts - nystatin cream (MYCOSTATIN); Apply 1 application topically 2 (two) times daily. Apply to affected area BID for up to 7 days.  Dispense: 30 g; Refill: 0  Addendum: Will give her the flu shot today  She will check with her primary about Tdap

## 2021-09-19 ENCOUNTER — Encounter: Payer: Self-pay | Admitting: Obstetrics and Gynecology

## 2021-09-19 ENCOUNTER — Other Ambulatory Visit: Payer: Self-pay

## 2021-09-19 ENCOUNTER — Ambulatory Visit (INDEPENDENT_AMBULATORY_CARE_PROVIDER_SITE_OTHER): Payer: 59 | Admitting: Obstetrics and Gynecology

## 2021-09-19 VITALS — BP 138/96 | HR 94 | Resp 22 | Ht 64.0 in | Wt 296.0 lb

## 2021-09-19 DIAGNOSIS — Z23 Encounter for immunization: Secondary | ICD-10-CM | POA: Diagnosis not present

## 2021-09-19 DIAGNOSIS — Z01419 Encounter for gynecological examination (general) (routine) without abnormal findings: Secondary | ICD-10-CM

## 2021-09-19 DIAGNOSIS — B372 Candidiasis of skin and nail: Secondary | ICD-10-CM | POA: Diagnosis not present

## 2021-09-19 MED ORDER — NYSTATIN 100000 UNIT/GM EX CREA: 1.0000 "application " | TOPICAL_CREAM | Freq: Two times a day (BID) | CUTANEOUS | 0 refills | Status: DC

## 2021-09-19 NOTE — Patient Instructions (Signed)

## 2021-10-11 ENCOUNTER — Other Ambulatory Visit: Payer: Self-pay | Admitting: Family Medicine

## 2021-10-11 ENCOUNTER — Other Ambulatory Visit: Payer: Self-pay | Admitting: Obstetrics and Gynecology

## 2021-10-11 DIAGNOSIS — Z1231 Encounter for screening mammogram for malignant neoplasm of breast: Secondary | ICD-10-CM

## 2021-11-07 ENCOUNTER — Ambulatory Visit: Payer: 59

## 2021-11-10 DIAGNOSIS — F419 Anxiety disorder, unspecified: Secondary | ICD-10-CM | POA: Insufficient documentation

## 2021-11-10 DIAGNOSIS — E785 Hyperlipidemia, unspecified: Secondary | ICD-10-CM | POA: Insufficient documentation

## 2021-11-10 DIAGNOSIS — E039 Hypothyroidism, unspecified: Secondary | ICD-10-CM | POA: Insufficient documentation

## 2021-11-10 DIAGNOSIS — K589 Irritable bowel syndrome without diarrhea: Secondary | ICD-10-CM | POA: Insufficient documentation

## 2021-11-10 DIAGNOSIS — J309 Allergic rhinitis, unspecified: Secondary | ICD-10-CM | POA: Insufficient documentation

## 2021-11-10 DIAGNOSIS — F324 Major depressive disorder, single episode, in partial remission: Secondary | ICD-10-CM | POA: Insufficient documentation

## 2021-11-10 DIAGNOSIS — N1831 Chronic kidney disease, stage 3a: Secondary | ICD-10-CM | POA: Insufficient documentation

## 2022-11-17 ENCOUNTER — Encounter: Payer: Self-pay | Admitting: Obstetrics and Gynecology

## 2023-05-19 DIAGNOSIS — M79645 Pain in left finger(s): Secondary | ICD-10-CM | POA: Insufficient documentation

## 2023-05-20 DIAGNOSIS — M189 Osteoarthritis of first carpometacarpal joint, unspecified: Secondary | ICD-10-CM | POA: Insufficient documentation

## 2023-05-22 ENCOUNTER — Other Ambulatory Visit: Payer: Self-pay

## 2023-05-22 ENCOUNTER — Ambulatory Visit
Admission: EM | Admit: 2023-05-22 | Discharge: 2023-05-22 | Disposition: A | Discharge status: Home / Self Care | Payer: 59 | Attending: Family Medicine | Admitting: Family Medicine

## 2023-05-22 DIAGNOSIS — R350 Frequency of micturition: Secondary | ICD-10-CM | POA: Diagnosis present

## 2023-05-22 DIAGNOSIS — N390 Urinary tract infection, site not specified: Secondary | ICD-10-CM | POA: Diagnosis not present

## 2023-05-22 DIAGNOSIS — Z87440 Personal history of urinary (tract) infections: Secondary | ICD-10-CM | POA: Insufficient documentation

## 2023-05-22 DIAGNOSIS — L811 Chloasma: Secondary | ICD-10-CM | POA: Insufficient documentation

## 2023-05-22 LAB — POCT URINALYSIS DIP (MANUAL ENTRY)
Bilirubin, UA: NEGATIVE
Glucose, UA: NEGATIVE mg/dL
Ketones, POC UA: NEGATIVE mg/dL
Nitrite, UA: NEGATIVE
Protein Ur, POC: NEGATIVE mg/dL
Spec Grav, UA: 1.01 (ref 1.010–1.025)
Urobilinogen, UA: 0.2 E.U./dL
pH, UA: 6 (ref 5.0–8.0)

## 2023-05-22 MED ORDER — NITROFURANTOIN MONOHYD MACRO 100 MG PO CAPS: 100.0000 mg | ORAL_CAPSULE | Freq: Two times a day (BID) | ORAL | 0 refills | Status: AC

## 2023-05-22 NOTE — ED Provider Notes (Signed)
[name]    CSN: [id number] Arrival date & time: 05/22/23  1113      History   Chief Complaint Chief Complaint  Patient presents with   UTI Symptoms    HPI Lauren Wilcox is a 47 y.o. female.   HPI Lauren Wilcox is a 47 y.o. female presents for evaluation of urinary frequency, urgency and dysuria x 1 day. Patient has a history of recurrent UTIs.  Denies any nausea, vomiting, hematuria, flank pain or fever.  Denies any concerns for STDs or and denies vaginal discharge.  Patient is status post hysterectomy 2021.  Denies any other complaints.    Past Medical History:  Diagnosis Date   Anxiety    Depression    GERD (gastroesophageal reflux disease)    Hypercholesterolemia    IBS (irritable bowel syndrome)    Migraine without aura    Restless leg syndrome     Patient Active Problem List   Diagnosis Date Noted   Melasma 05/22/2023   Osteoarthritis of carpometacarpal (CMC) joint of thumb 05/20/2023   Pain of left thumb 05/19/2023   Acquired hypothyroidism 11/10/2021   Allergic rhinitis 11/10/2021   Anxiety disorder 11/10/2021   Hyperlipidemia 11/10/2021   Irritable bowel syndrome 11/10/2021   Major depression single episode, in partial remission (HCC) 11/10/2021   Morbid obesity (HCC) 11/10/2021   Stage 3a chronic kidney disease (HCC) 11/10/2021   GERD (gastroesophageal reflux disease) 04/12/2021   Gastro-esophageal reflux disease without esophagitis 04/12/2021   S/P laparoscopic hysterectomy 09/25/2020   Hx of migraines 02/24/2020   Elevated BP without diagnosis of hypertension 02/24/2020   BMI 50.0-59.9, adult (HCC) 02/24/2020   Menorrhagia with irregular cycle 02/24/2020   Body mass index (BMI) 50.0-59.9, adult (HCC) 02/24/2020   Restless leg syndrome 05/11/2017   Restless legs syndrome 05/11/2017    Past Surgical History:  Procedure Laterality Date   CHOLECYSTECTOMY     CHOLECYSTECTOMY, LAPAROSCOPIC     pt.states  complication--"sleepy colon and kidney"   CYSTOSCOPY N/A 09/25/2020   Procedure: CYSTOSCOPY;  Surgeon: Romualdo Bolk, MD;  Location: [facility] OR;  Service: Gynecology;  Laterality: N/A;   DERMOID CYST  EXCISION  2010   TONSILLECTOMY AND ADENOIDECTOMY     age 49   TOTAL LAPAROSCOPIC HYSTERECTOMY WITH SALPINGECTOMY N/A 09/25/2020   Procedure: TOTAL LAPAROSCOPIC HYSTERECTOMY WITH BILATERAL SALPINGECTOMY;  Surgeon: Romualdo Bolk, MD;  Location: [facility] OR;  Service: Gynecology;  Laterality: N/A;   TUBAL LIGATION     WISDOM TOOTH EXTRACTION  11/10/2004    OB History     Gravida  2   Para  1   Term  1   Preterm      AB  1   Living  1      SAB  1   IAB      Ectopic      Multiple      Live Births               Home Medications    Prior to Admission medications   Medication Sig Start Date End Date Taking? Authorizing Provider  ALPRAZolam Prudy Feeler) 0.25 MG tablet Take by mouth. 09/23/21  Yes [provider]  buPROPion (WELLBUTRIN XL) 300 MG 24 hr tablet Take 300 mg by mouth daily. 03/08/20  Yes [provider]  buPROPion (WELLBUTRIN XL) 300 MG 24 hr tablet Take 1 tablet by mouth every morning. 03/08/20  Yes [provider]  cetirizine (ZYRTEC) 10 MG tablet 1 (  one) time each day at the same time.   Yes [provider]  levothyroxine (SYNTHROID) 25 MCG tablet Take 25 mcg by mouth daily. 09/06/21  Yes [provider]  nitrofurantoin, macrocrystal-monohydrate, (MACROBID) 100 MG capsule Take 1 capsule (100 mg total) by mouth 2 (two) times daily for 3 days. 05/22/23 05/25/23 Yes Bing Neighbors, NP  pantoprazole (PROTONIX) 40 MG tablet Take 1 tablet by mouth every morning. 01/15/20  Yes [provider]  pravastatin (PRAVACHOL) 40 MG tablet Take 40 mg by mouth daily. 02/07/20  Yes [provider]  pravastatin (PRAVACHOL) 40 MG tablet Take 1 tablet by mouth every morning. 02/07/20  Yes [provider]   Rimegepant Sulfate (NURTEC PO)    Yes [provider]  rOPINIRole (REQUIP) 2 MG tablet Take 2 mg by mouth at bedtime. 10/09/19  Yes [provider]  topiramate (TOPAMAX) 50 MG tablet Take 50 mg by mouth daily. 02/07/20  Yes [provider]  topiramate (TOPAMAX) 50 MG tablet 1 (one) time each day at the same time. 02/07/20  Yes [provider]  tretinoin (RETIN-A) 0.025 % cream 1 application in the evening to face Externally Pea size amount to whole face at night for 30 days 07/03/20  Yes [provider]  UNABLE TO FIND AMBI FACE CREAM 2% 07/03/20  Yes [provider]  venlafaxine XR (EFFEXOR-XR) 75 MG 24 hr capsule Take 225 mg by mouth daily. 02/08/20  Yes [provider]  venlafaxine XR (EFFEXOR-XR) 75 MG 24 hr capsule Take by mouth. 02/08/20  Yes [provider]  ALPRAZolam (XANAX) 0.25 MG tablet Take 0.125-0.25 mg by mouth 2 (two) times daily as needed for anxiety.     [provider]  ALPRAZOLAM PO ALPRAZolam    [provider]  aspirin-acetaminophen-caffeine (EXCEDRIN MIGRAINE) [phone] MG tablet Take 1 tablet by mouth every 6 (six) hours as needed.    [provider]  BUPROPION HCL ER, XL, PO buPROPion HCl ER (XL)    [provider]  BUPROPION HCL PO     [provider]  cetirizine (ZYRTEC) 10 MG tablet Take 10 mg by mouth daily.     [provider]  eletriptan (RELPAX) 40 MG tablet Take 40 mg by mouth as needed for migraine or headache. May repeat in 2 hours if headache persists or recurs.    [provider]  LEVOTHYROXINE SODIUM PO     [provider]  NORETHINDRONE PO Norethindrone    [provider]  nystatin cream (MYCOSTATIN) Apply 1 application topically 2 (two) times daily. Apply to affected area BID for up to 7 days. 09/19/21   Romualdo Bolk, MD  pantoprazole (PROTONIX) 40 MG tablet Take 40 mg by mouth daily. 01/15/20   [provider]  PANTOPRAZOLE SODIUM PO Pantoprazole Sodium    [provider]  PRAVASTATIN SODIUM PO Pravastatin Sodium    [provider]  rizatriptan (MAXALT) 10 MG tablet Take by mouth. 07/16/21   [provider]  ROPINIROLE HCL PO rOPINIRole HCl    [provider]  SUMATRIPTAN SUCCINATE PO SUMAtriptan Succinate    [provider]  TOPIRAMATE PO Topiramate    [provider]  VENLAFAXINE HCL ER PO Venlafaxine HCl ER    [provider]  VENLAFAXINE HCL PO     [provider]    Family History Family History  Problem Relation Age of Onset   Heart attack Father 65  Dec   Kidney disease Brother    Cancer Maternal Grandmother 75       Dec from colon ca   Breast cancer Paternal Grandmother 76   Cancer Paternal Grandfather 65       Dec from colon ca    Social History Social History   Tobacco Use   Smoking status: Never   Smokeless tobacco: Never  Vaping Use   Vaping status: Never Used  Substance Use Topics   Alcohol use: Not Currently   Drug use: Never     Allergies   Sulfa antibiotics   Review of Systems Review of Systems Pertinent negatives listed in HPI   Physical Exam Triage Vital Signs ED Triage Vitals  Encounter Vitals Group     BP 05/22/23 1202 121/87     Systolic BP Percentile --      Diastolic BP Percentile --      Pulse Rate 05/22/23 1202 (!) 104     Resp 05/22/23 1202 20     Temp 05/22/23 1202 98.7 F (37.1 C)     Temp Source 05/22/23 1202 Oral     SpO2 05/22/23 1202 95 %     Weight 05/22/23 1158 285 lb (129.3 kg)     Height 05/22/23 1158 5\' 3"  (1.6 m)     Head Circumference --      Peak Flow --      Pain Score 05/22/23 1157 0     Pain Loc --      Pain Education --      Exclude from Growth Chart --    No data found.  Updated Vital Signs BP 121/87 (BP Location: Left Arm)   Pulse (!) 108   Temp 98.7 F (37.1 C) (Oral)   Resp 20   Ht 5\' 3"  (1.6 m)   Wt 285 lb  (129.3 kg)   LMP 08/13/2020   SpO2 95%   BMI 50.49 kg/m   Visual Acuity Right Eye Distance:   Left Eye Distance:   Bilateral Distance:    Right Eye Near:   Left Eye Near:    Bilateral Near:     Physical Exam Vitals reviewed.  HENT:     Head: Normocephalic and atraumatic.  Eyes:     Extraocular Movements: Extraocular movements intact.     Conjunctiva/sclera: Conjunctivae normal.     Pupils: Pupils are equal, round, and reactive to light.  Cardiovascular:     Rate and Rhythm: Normal rate and regular rhythm.  Pulmonary:     Effort: Pulmonary effort is normal.     Breath sounds: Normal breath sounds.  Musculoskeletal:     Cervical back: Normal range of motion.  Neurological:     General: No focal deficit present.     Mental Status: She is alert.      UC Treatments / Results  Labs (all labs ordered are listed, but only abnormal results are displayed) Labs Reviewed  POCT URINALYSIS DIP (MANUAL ENTRY) - Abnormal; Notable for the following components:      Result Value   Clarity, UA hazy (*)    Blood, UA moderate (*)    Leukocytes, UA Small (1+) (*)    All other components within normal limits  URINE CULTURE    EKG   Radiology No results found.  Procedures Procedures (including critical care time)  Medications Ordered in UC Medications - No data to display  Initial Impression / Assessment and Plan / UC Course  I have reviewed the  triage vital signs and the nursing notes.  Pertinent labs & imaging results that were available during my care of the patient were reviewed by me and considered in my medical decision making (see chart for details).      UA abnormal and findings consistent with UTI. Empiric antibiotic treatment initiated, Macrobid x 3 days while awaiting urine culture. Encouraged increase intake of water. Urine culture pending.  ER if symptoms become severe. Follow-up with PCP if symptoms do not completely resolve.  Final Clinical  Impressions(s) / UC Diagnoses   Final diagnoses:  Acute UTI     Discharge Instructions      Urine culture is pending and will result within 72 hours.  Our office will call you if your culture reveals that additional treatment is warranted.  Your symptoms worsen before urine culture results please notify our office or return for evaluaiton      ED Prescriptions     Medication Sig Dispense Auth. Provider   nitrofurantoin, macrocrystal-monohydrate, (MACROBID) 100 MG capsule Take 1 capsule (100 mg total) by mouth 2 (two) times daily for 3 days. 6 capsule Bing Neighbors, NP      PDMP not reviewed this encounter.   Bing Neighbors, NP 05/22/23 1429

## 2023-05-22 NOTE — Discharge Instructions (Signed)
Urine culture is pending and will result within 72 hours.  Our office will call you if your culture reveals that additional treatment is warranted.  Your symptoms worsen before urine culture results please notify our office or return for evaluaiton

## 2023-05-22 NOTE — ED Triage Notes (Signed)
"  I think I have a UTI". Dysuria with frequency that started yesterday. No concern for STI.

## 2023-05-23 LAB — URINE CULTURE
Culture: 50000 — AB
Special Requests: NORMAL

## 2023-05-24 LAB — URINE CULTURE

## 2023-11-18 ENCOUNTER — Ambulatory Visit
Admission: EM | Admit: 2023-11-18 | Discharge: 2023-11-18 | Disposition: A | Discharge status: Home / Self Care | Payer: 59 | Attending: Physician Assistant | Admitting: Physician Assistant

## 2023-11-18 DIAGNOSIS — U071 COVID-19: Secondary | ICD-10-CM | POA: Diagnosis present

## 2023-11-18 DIAGNOSIS — J069 Acute upper respiratory infection, unspecified: Secondary | ICD-10-CM

## 2023-11-18 DIAGNOSIS — H65191 Other acute nonsuppurative otitis media, right ear: Secondary | ICD-10-CM

## 2023-11-18 LAB — POCT INFLUENZA A/B
Influenza A, POC: NEGATIVE
Influenza B, POC: NEGATIVE

## 2023-11-18 MED ORDER — AMOXICILLIN-POT CLAVULANATE 875-125 MG PO TABS: 1.0000 | ORAL_TABLET | Freq: Two times a day (BID) | ORAL | 0 refills | Status: DC

## 2023-11-18 NOTE — ED Provider Notes (Signed)
 [name]    CSN: [id number] Arrival date & time: 11/18/23  0951      History   Chief Complaint Chief Complaint  Patient presents with   Fever   Cough   Nasal Congestion   Pain    HPI Lauren Wilcox is a 48 y.o. female.   Patient here today for evaluation of sinus drainage and congestion, right ear pain, and nonproductive cough that started a few days ago.  She states that she has had a fever with Tmax of 101.5.  She has not had any shortness of breath or anything.  She denies any dizziness.  She has taken Zyrtec without resolution.  The history is provided by the patient.  Fever Associated symptoms: congestion, cough and ear pain   Associated symptoms: no diarrhea, no nausea, no sore throat and no vomiting   Cough Associated symptoms: ear pain and fever   Associated symptoms: no eye discharge, no shortness of breath, no sore throat and no wheezing     Past Medical History:  Diagnosis Date   Anxiety    Depression    GERD (gastroesophageal reflux disease)    Hypercholesterolemia    IBS (irritable bowel syndrome)    Migraine without aura    Restless leg syndrome     Patient Active Problem List   Diagnosis Date Noted   Melasma 05/22/2023   Osteoarthritis of carpometacarpal (CMC) joint of thumb 05/20/2023   Pain of left thumb 05/19/2023   Acquired hypothyroidism 11/10/2021   Allergic rhinitis 11/10/2021   Anxiety disorder 11/10/2021   Hyperlipidemia 11/10/2021   Irritable bowel syndrome 11/10/2021   Major depression single episode, in partial remission (HCC) 11/10/2021   Morbid obesity (HCC) 11/10/2021   Stage 3a chronic kidney disease (HCC) 11/10/2021   GERD (gastroesophageal reflux disease) 04/12/2021   Gastro-esophageal reflux disease without esophagitis 04/12/2021   S/P laparoscopic hysterectomy 09/25/2020   Hx of migraines 02/24/2020   Elevated BP without diagnosis of hypertension 02/24/2020   BMI 50.0-59.9, adult (HCC) 02/24/2020    Menorrhagia with irregular cycle 02/24/2020   Body mass index (BMI) 50.0-59.9, adult (HCC) 02/24/2020   Restless leg syndrome 05/11/2017   Restless legs syndrome 05/11/2017    Past Surgical History:  Procedure Laterality Date   CHOLECYSTECTOMY     CHOLECYSTECTOMY, LAPAROSCOPIC     pt.states complication--sleepy colon and kidney   CYSTOSCOPY N/A 09/25/2020   Procedure: CYSTOSCOPY;  Surgeon: Jannis Kate Norris, MD;  Location: [facility] OR;  Service: Gynecology;  Laterality: N/A;   DERMOID CYST  EXCISION  2010   TONSILLECTOMY AND ADENOIDECTOMY     age 66   TOTAL LAPAROSCOPIC HYSTERECTOMY WITH SALPINGECTOMY N/A 09/25/2020   Procedure: TOTAL LAPAROSCOPIC HYSTERECTOMY WITH BILATERAL SALPINGECTOMY;  Surgeon: Jannis Kate Norris, MD;  Location: [facility] OR;  Service: Gynecology;  Laterality: N/A;   TUBAL LIGATION     WISDOM TOOTH EXTRACTION  11/10/2004    OB History     Gravida  2   Para  1   Term  1   Preterm      AB  1   Living  1      SAB  1   IAB      Ectopic      Multiple      Live Births               Home Medications    Prior to Admission medications   Medication Sig Start Date End Date Taking? Authorizing Provider  ALPRAZolam (XANAX) 0.25 MG tablet Take 0.125-0.25 mg by mouth 2 (two) times daily as needed for anxiety.    Yes [provider]  amoxicillin -clavulanate (AUGMENTIN ) 875-125 MG tablet Take 1 tablet by mouth every 12 (twelve) hours. 11/18/23  Yes Billy Asberry FALCON, PA-C  buPROPion  (WELLBUTRIN  XL) 300 MG 24 hr tablet Take 300 mg by mouth daily. 03/08/20  Yes [provider]  levothyroxine (SYNTHROID) 25 MCG tablet Take 25 mcg by mouth daily. 09/06/21  Yes [provider]  pantoprazole  (PROTONIX ) 40 MG tablet Take 1 tablet by mouth every morning. 01/15/20  Yes [provider]  pravastatin  (PRAVACHOL ) 40 MG tablet Take 40 mg by mouth daily. 02/07/20  Yes [provider]  rOPINIRole  (REQUIP ) 2 MG tablet Take 2 mg by  mouth at bedtime. 10/09/19  Yes [provider]  topiramate  (TOPAMAX ) 50 MG tablet 1 (one) time each day at the same time. 02/07/20  Yes [provider]  venlafaxine  XR (EFFEXOR -XR) 75 MG 24 hr capsule Take 225 mg by mouth daily. 02/08/20  Yes [provider]  ALPRAZolam (XANAX) 0.25 MG tablet Take by mouth. 09/23/21   [provider]  ALPRAZOLAM PO ALPRAZolam    [provider]  aspirin-acetaminophen -caffeine (EXCEDRIN MIGRAINE) 250-250-65 MG tablet Take 1 tablet by mouth every 6 (six) hours as needed.    [provider]  BUPROPION  HCL ER, XL, PO buPROPion  HCl ER (XL)    [provider]  BUPROPION  HCL PO     [provider]  cetirizine (ZYRTEC) 10 MG tablet Take 10 mg by mouth daily.     [provider]  cetirizine (ZYRTEC) 10 MG tablet 1 (one) time each day at the same time.    [provider]  eletriptan (RELPAX) 40 MG tablet Take 40 mg by mouth as needed for migraine or headache. May repeat in 2 hours if headache persists or recurs.    [provider]  LEVOTHYROXINE SODIUM PO     [provider]  NORETHINDRONE PO Norethindrone    [provider]  NURTEC 75 MG TBDP Take 1 tablet by mouth daily as needed. 10/19/23   [provider]  nystatin  cream (MYCOSTATIN ) Apply 1 application topically 2 (two) times daily. Apply to affected area BID for up to 7 days. 09/19/21   Jertson, Jill Evelyn, MD  pantoprazole  (PROTONIX ) 40 MG tablet Take 40 mg by mouth daily. 01/15/20   [provider]  PANTOPRAZOLE  SODIUM PO Pantoprazole  Sodium    [provider]  pravastatin  (PRAVACHOL ) 40 MG tablet Take 1 tablet by mouth every morning. 02/07/20   [provider]  PRAVASTATIN  SODIUM PO Pravastatin  Sodium    [provider]  Rimegepant Sulfate (NURTEC PO)     [provider]  rizatriptan (MAXALT) 10 MG tablet Take by mouth. 07/16/21   [provider]  SUMATRIPTAN  SUCCINATE PO SUMAtriptan  Succinate    [provider]  topiramate  (TOPAMAX ) 50 MG tablet Take 50 mg by mouth daily. 02/07/20   [provider]  tretinoin (RETIN-A) 0.025 % cream 1 application in the evening to face Externally Pea size amount to whole face at night for 30 days 07/03/20   [provider]  UNABLE TO FIND AMBI FACE CREAM 2% 07/03/20   [provider]  VENLAFAXINE  HCL ER PO Venlafaxine  HCl ER    [provider]  VENLAFAXINE  HCL PO     [provider]  venlafaxine  XR (EFFEXOR -XR) 75 MG 24 hr  capsule Take by mouth. 02/08/20   [provider]    Family History Family History  Problem Relation Age of Onset   Heart attack Father 29       Dec   Kidney disease Brother    Cancer Maternal Grandmother 53       Dec from colon ca   Breast cancer Paternal Grandmother 57   Cancer Paternal Grandfather 60       Dec from colon ca    Social History Social History   Tobacco Use   Smoking status: Never    Passive exposure: Never   Smokeless tobacco: Never  Vaping Use   Vaping status: Never Used  Substance Use Topics   Alcohol use: Not Currently   Drug use: Never     Allergies   Sulfa antibiotics   Review of Systems Review of Systems  Constitutional:  Positive for fever.  HENT:  Positive for congestion and ear pain. Negative for sore throat.   Eyes:  Negative for discharge and redness.  Respiratory:  Positive for cough. Negative for shortness of breath and wheezing.   Gastrointestinal:  Negative for abdominal pain, diarrhea, nausea and vomiting.     Physical Exam Triage Vital Signs ED Triage Vitals  Encounter Vitals Group     BP 11/18/23 1009 110/76     Systolic BP Percentile --      Diastolic BP Percentile --      Pulse Rate 11/18/23 1009 (!) 119     Resp 11/18/23 1009 20     Temp 11/18/23 1009 100 F (37.8 C)     Temp Source 11/18/23 1009 Oral     SpO2 11/18/23 1009 96 %     Weight  11/18/23 1004 285 lb (129.3 kg)     Height 11/18/23 1004 5' 5 (1.651 m)     Head Circumference --      Peak Flow --      Pain Score 11/18/23 1003 3     Pain Loc --      Pain Education --      Exclude from Growth Chart --    No data found.  Updated Vital Signs BP 110/76 (BP Location: Left Arm)   Pulse (!) 112   Temp 100 F (37.8 C) (Oral)   Resp 20   Ht 5' 5 (1.651 m)   Wt 285 lb (129.3 kg)   LMP 08/13/2020   SpO2 96%   BMI 47.43 kg/m   Visual Acuity Right Eye Distance:   Left Eye Distance:   Bilateral Distance:    Right Eye Near:   Left Eye Near:    Bilateral Near:     Physical Exam Vitals and nursing note reviewed.  Constitutional:      General: She is not in acute distress.    Appearance: Normal appearance. She is not ill-appearing.  HENT:     Head: Normocephalic and atraumatic.     Left Ear: Tympanic membrane normal.     Ears:     Comments: Right TM erythematous and bulging    Nose: Congestion present.     Mouth/Throat:     Mouth: Mucous membranes are moist.     Pharynx: No oropharyngeal exudate or posterior oropharyngeal erythema.  Eyes:     Conjunctiva/sclera: Conjunctivae normal.  Cardiovascular:     Rate and Rhythm: Normal rate and regular rhythm.     Heart sounds: Normal heart sounds. No murmur heard. Pulmonary:     Effort: Pulmonary effort  is normal. No respiratory distress.     Breath sounds: Normal breath sounds. No wheezing, rhonchi or rales.  Skin:    General: Skin is warm and dry.  Neurological:     Mental Status: She is alert.  Psychiatric:        Mood and Affect: Mood normal.        Thought Content: Thought content normal.      UC Treatments / Results  Labs (all labs ordered are listed, but only abnormal results are displayed) Labs Reviewed  POCT INFLUENZA A/B - Normal  SARS CORONAVIRUS 2 (TAT 6-24 HRS)    EKG   Radiology No results found.  Procedures Procedures (including critical care time)  Medications Ordered in  UC Medications - No data to display  Initial Impression / Assessment and Plan / UC Course  I have reviewed the triage vital signs and the nursing notes.  Pertinent labs & imaging results that were available during my care of the patient were reviewed by me and considered in my medical decision making (see chart for details).     Will treat to cover otitis media with Augmentin .  Discussed concerns for other viral etiology of symptoms and flu test negative in office.  Will order COVID screening.  Recommend symptomatic treatment otherwise with increase fluids and rest.  Advised follow-up if no better improvement with any further concerns. Final Clinical Impressions(s) / UC Diagnoses   Final diagnoses:  Acute upper respiratory infection  Other acute nonsuppurative otitis media of right ear, recurrence not specified   Discharge Instructions   None    ED Prescriptions     Medication Sig Dispense Auth. Provider   amoxicillin -clavulanate (AUGMENTIN ) 875-125 MG tablet Take 1 tablet by mouth every 12 (twelve) hours. 14 tablet Billy Asberry FALCON, PA-C      PDMP not reviewed this encounter.   Billy Asberry FALCON, PA-C 11/18/23 1132

## 2023-11-18 NOTE — ED Triage Notes (Signed)
 I have had a Fever of 101.5 for 2 days, Sinus drainage and pain in my upper left clavicle area (not chest), right ear pain, non productive cough at times. All symptoms started about 2 days ago, but I was out over the weekend so ? Exposure to illness. No sob. No wheezing. No respiratory distress. No dizziness.

## 2023-11-19 LAB — SARS CORONAVIRUS 2 (TAT 6-24 HRS): SARS Coronavirus 2: POSITIVE — AB

## 2023-12-03 ENCOUNTER — Encounter: Payer: Self-pay | Admitting: Obstetrics and Gynecology

## 2023-12-29 ENCOUNTER — Encounter: Payer: Self-pay | Admitting: Neurology

## 2023-12-29 ENCOUNTER — Ambulatory Visit (INDEPENDENT_AMBULATORY_CARE_PROVIDER_SITE_OTHER): Payer: 59 | Admitting: Neurology

## 2023-12-29 VITALS — BP 140/92 | Ht 65.0 in | Wt 287.0 lb

## 2023-12-29 DIAGNOSIS — R519 Headache, unspecified: Secondary | ICD-10-CM

## 2023-12-29 DIAGNOSIS — G43709 Chronic migraine without aura, not intractable, without status migrainosus: Secondary | ICD-10-CM | POA: Insufficient documentation

## 2023-12-29 DIAGNOSIS — G8929 Other chronic pain: Secondary | ICD-10-CM

## 2023-12-29 DIAGNOSIS — R51 Headache with orthostatic component, not elsewhere classified: Secondary | ICD-10-CM

## 2023-12-29 DIAGNOSIS — H539 Unspecified visual disturbance: Secondary | ICD-10-CM | POA: Diagnosis not present

## 2023-12-29 MED ORDER — AJOVY 225 MG/1.5ML ~~LOC~~ SOAJ: 225.0000 mg | SUBCUTANEOUS | 11 refills | Status: DC

## 2023-12-29 MED ORDER — ONDANSETRON 4 MG PO TBDP: 4.0000 mg | ORAL_TABLET | Freq: Three times a day (TID) | ORAL | 11 refills | Status: DC | PRN

## 2023-12-29 MED ORDER — FREMANEZUMAB-VFRM 225 MG/1.5ML ~~LOC~~ SOSY: 225.0000 mg | PREFILLED_SYRINGE | Freq: Once | SUBCUTANEOUS | Status: DC

## 2023-12-29 NOTE — Progress Notes (Signed)
 [id number]    Provider:  Dr Lucia Gaskins Requesting Provider: Noberto Retort, MD Primary Care Provider:  Noberto Retort, MD  CC:  migraines  HPI:  Lauren Wilcox is a 48 y.o. female here as requested by Noberto Retort, MD for migraines. has Hx of migraines; Elevated BP without diagnosis of hypertension; BMI 50.0-59.9, adult (HCC); Menorrhagia with irregular cycle; S/P laparoscopic hysterectomy; GERD (gastroesophageal reflux disease); Restless leg syndrome; Acquired hypothyroidism; Allergic rhinitis; Anxiety disorder; Hyperlipidemia; Irritable bowel syndrome; Major depression single episode, in partial remission (HCC); Melasma; Morbid obesity (HCC); Stage 3a chronic kidney disease (HCC); Body mass index (BMI) 50.0-59.9, adult (HCC); Gastro-esophageal reflux disease without esophagitis; Restless legs syndrome; Pain of left thumb; Osteoarthritis of carpometacarpal (CMC) joint of thumb; Chronic intractable headache; and Chronic migraine without aura without status migrainosus, not intractable on their problem list.   Reviewed notes, labs and imaging from outside physicians, which showed:   Nurtec works but using more than she has. She has at least 20 total headache daysa  month and at least 10 moderate to severe migraines days. Pulsating/pounding/throbbing/, she is taking Advil so much it is hurting her kidneys, she has nausea, vomiting, photosensitivity, phono sensitivity, osmophobia osmophobia, lights and sounds can make it worse, sleep or in a dark room helps, she has at least 10-12 moderate to severe migraine days a month, no medication overuse no aura as we know of now but she is having new vision problems, it hurts to move, I dark room helps, this has been going on for at least 6 months, she has tried and failed multiple medications, Nurtec helps acutely but she has more migraines that she can take Nurtec.  Triggers are sudden and drastic weather changes, severe  thunderstorms, winter spring is the worst stress at times she has a prodrome 1 to 2 days where she is tired drained and moody during she has visit difficulty focusing and pain, nausea, she is tired, she is moody, pain in the back of the head moving towards the top, it can last 24 to 72 hours.  Can be behind her eyes, can start unilaterally or bilaterally, severe. She can wake up with morning headaches, can worse in the morning positionally, worsening migraines, positional migraines, vision changes, she is leaving work multiple times a week, affecting affecting quality of life. Snoring but not significantly tired, her sleep is fine, no napping, wakes refreshed.   11/2022: fREE t4 .72 bun 13 cREATININE 0.89 HGBA1C 5.5  Review of Systems: Patient complains of symptoms per HPI as well as the following symptoms NONE. Pertinent negatives and positives per HPI. All others negative.  From a thorugh review of records and patient report Meds tried> 3 months: Venlafaxine, bupropion, topiramate, Nurtec, Imitrex, Maxalt, amitriptyline, nortriptyline, propranolol is contraindicated due to asthma as she has a family history and was diagnosed during pregnancy also patient at home takes her blood pressure and has hypotension today her blood pressure is slightly increased due to migraine other blood pressure medications contraindicated due to hypotension. Aimovig is cotraindicated due to constipation (has IBS) had hysterectomy.   Social History   Socioeconomic History   Marital status: Married    Spouse name: Not on file   Number of children: Not on file   Years of education: Not on file   Highest education level: Not on file  Occupational History   Not on file  Tobacco Use   Smoking status: Never    Passive exposure: Never  Smokeless tobacco: Never  Vaping Use   Vaping status: Never Used  Substance and Sexual Activity   Alcohol use: Not Currently   Drug use: Never   Sexual activity: Not Currently     Comment: [employer]  Other Topics Concern   Not on file  Social History Narrative   Not on file   Social Drivers of Health   Financial Resource Strain: Not on file  Food Insecurity: Not on file  Transportation Needs: Not on file  Physical Activity: Not on file  Stress: Not on file  Social Connections: Not on file  Intimate Partner Violence: Not on file    Family History  Problem Relation Age of Onset   Migraines Mother    Heart attack Father 29       Dec   Kidney disease Brother    Cancer Maternal Grandmother 4       Dec from colon ca   Breast cancer Paternal Grandmother 1   Cancer Paternal Grandfather 62       Dec from colon ca    Past Medical History:  Diagnosis Date   Anxiety    Depression    GERD (gastroesophageal reflux disease)    Hypercholesterolemia    IBS (irritable bowel syndrome)    Migraine without aura    Restless leg syndrome     Patient Active Problem List   Diagnosis Date Noted   Chronic intractable headache 12/29/2023   Chronic migraine without aura without status migrainosus, not intractable 12/29/2023   Melasma 05/22/2023   Osteoarthritis of carpometacarpal (CMC) joint of thumb 05/20/2023   Pain of left thumb 05/19/2023   Acquired hypothyroidism 11/10/2021   Allergic rhinitis 11/10/2021   Anxiety disorder 11/10/2021   Hyperlipidemia 11/10/2021   Irritable bowel syndrome 11/10/2021   Major depression single episode, in partial remission (HCC) 11/10/2021   Morbid obesity (HCC) 11/10/2021   Stage 3a chronic kidney disease (HCC) 11/10/2021   GERD (gastroesophageal reflux disease) 04/12/2021   Gastro-esophageal reflux disease without esophagitis 04/12/2021   S/P laparoscopic hysterectomy 09/25/2020   Hx of migraines 02/24/2020   Elevated BP without diagnosis of hypertension 02/24/2020   BMI 50.0-59.9, adult (HCC) 02/24/2020   Menorrhagia with irregular cycle 02/24/2020   Body mass index (BMI) 50.0-59.9, adult (HCC) 02/24/2020   Restless  leg syndrome 05/11/2017   Restless legs syndrome 05/11/2017    Past Surgical History:  Procedure Laterality Date   CHOLECYSTECTOMY     CHOLECYSTECTOMY, LAPAROSCOPIC     pt.states complication--"sleepy colon and kidney"   CYSTOSCOPY N/A 09/25/2020   Procedure: CYSTOSCOPY;  Surgeon: Romualdo Bolk, MD;  Location: [facility] OR;  Service: Gynecology;  Laterality: N/A;   DERMOID CYST  EXCISION  2010   TONSILLECTOMY AND ADENOIDECTOMY     age 75   TOTAL LAPAROSCOPIC HYSTERECTOMY WITH SALPINGECTOMY N/A 09/25/2020   Procedure: TOTAL LAPAROSCOPIC HYSTERECTOMY WITH BILATERAL SALPINGECTOMY;  Surgeon: Romualdo Bolk, MD;  Location: [facility] OR;  Service: Gynecology;  Laterality: N/A;   TUBAL LIGATION     WISDOM TOOTH EXTRACTION  11/10/2004    Current Outpatient Medications  Medication Sig Dispense Refill   ALPRAZolam (XANAX) 0.25 MG tablet Take 0.125-0.25 mg by mouth 2 (two) times daily as needed for anxiety.      ALPRAZolam (XANAX) 0.25 MG tablet Take by mouth.     ALPRAZOLAM PO ALPRAZolam     aspirin-acetaminophen-caffeine (EXCEDRIN MIGRAINE) 250-250-65 MG tablet Take 1 tablet by mouth every 6 (six) hours as needed.     buPROPion (  WELLBUTRIN XL) 300 MG 24 hr tablet Take 300 mg by mouth daily.     BUPROPION HCL ER, XL, PO buPROPion HCl ER (XL)     BUPROPION HCL PO      cetirizine (ZYRTEC) 10 MG tablet Take 10 mg by mouth daily.      cetirizine (ZYRTEC) 10 MG tablet 1 (one) time each day at the same time.     Fremanezumab-vfrm (AJOVY) 225 MG/1.5ML SOAJ Inject 225 mg into the skin every 30 (thirty) days. Please use copay card: bIN# [id number] PCN# [id number] GRP# [id number] ID# [id number] 1.5 mL 11   levothyroxine (SYNTHROID) 25 MCG tablet Take 25 mcg by mouth daily.     LEVOTHYROXINE SODIUM PO      NURTEC 75 MG TBDP Take 1 tablet by mouth daily as needed.     ondansetron (ZOFRAN-ODT) 4 MG disintegrating tablet Take 1-2 tablets (4-8 mg total) by mouth every 8 (eight) hours as needed. 30 tablet 11    pantoprazole (PROTONIX) 40 MG tablet Take 40 mg by mouth daily.     pantoprazole (PROTONIX) 40 MG tablet Take 1 tablet by mouth every morning.     PANTOPRAZOLE SODIUM PO Pantoprazole Sodium     pravastatin (PRAVACHOL) 40 MG tablet Take 40 mg by mouth daily.     pravastatin (PRAVACHOL) 40 MG tablet Take 1 tablet by mouth every morning.     PRAVASTATIN SODIUM PO Pravastatin Sodium     Rimegepant Sulfate (NURTEC PO)      rOPINIRole (REQUIP) 2 MG tablet Take 2 mg by mouth at bedtime.     topiramate (TOPAMAX) 50 MG tablet Take 50 mg by mouth daily.     tretinoin (RETIN-A) 0.025 % cream 1 application in the evening to face Externally Pea size amount to whole face at night for 30 days     VENLAFAXINE HCL ER PO Venlafaxine HCl ER     VENLAFAXINE HCL PO      venlafaxine XR (EFFEXOR-XR) 75 MG 24 hr capsule Take 225 mg by mouth daily.     venlafaxine XR (EFFEXOR-XR) 75 MG 24 hr capsule Take by mouth.     topiramate (TOPAMAX) 50 MG tablet 1 (one) time each day at the same time. (Patient not taking: Reported on 12/29/2023)     Current Facility-Administered Medications  Medication Dose Route Frequency Provider Last Rate Last Admin   Fremanezumab-vfrm SOSY 225 mg  225 mg Subcutaneous Once         Allergies as of 12/29/2023 - Review Complete 12/29/2023  Allergen Reaction Noted   Sulfa antibiotics Other (See Comments) 02/21/2020    Vitals: BP (!) 140/92 (BP Location: Right Arm, Patient Position: Sitting, Cuff Size: Normal)   Ht 5\' 5"  (1.651 m)   Wt 287 lb (130.2 kg)   LMP 08/13/2020   BMI 47.76 kg/m  Last Weight:  Wt Readings from Last 1 Encounters:  12/29/23 287 lb (130.2 kg)   Last Height:   Ht Readings from Last 1 Encounters:  12/29/23 5\' 5"  (1.651 m)     Physical exam: Exam: Gen: NAD, conversant, well nourised, obese, well groomed                     CV: RRR, no MRG. No Carotid Bruits. No peripheral edema, warm, nontender Eyes: Conjunctivae clear without exudates or  hemorrhage  Neuro: Detailed Neurologic Exam  Speech:    Speech is normal; fluent and spontaneous with normal comprehension.  Cognition:    The patient  is oriented to person, place, and time;     recent and remote memory intact;     language fluent;     normal attention, concentration,     fund of knowledge Cranial Nerves:    The pupils are equal, round, and reactive to light. The fundi are normal and spontaneous venous pulsations are present. Visual fields are full to finger confrontation. Extraocular movements are intact. Trigeminal sensation is intact and the muscles of mastication are normal. The face is symmetric. The palate elevates in the midline. Hearing intact. Voice is normal. Shoulder shrug is normal. The tongue has normal motion without fasciculations.   Coordination:    Normal finger to nose and heel to shin. Normal rapid alternating movements.   Gait: nml  Motor Observation: nml Tone:    Normal muscle tone.    Posture:    Posture is normal. normal erect    Strength:    Strength is V/V in the upper and lower limbs.      Sensation: intact to LT     Reflex Exam:  DTR's:    Deep tendon reflexes in the upper and lower extremities are normal bilaterally.   Toes:    The toes are downgoing bilaterally.   Clonus:    Clonus is absent.    Assessment/Plan:  Chtonic migraines. Givenm concerning symptoms needs throough evaluation. Injected ajovy today, trained, f/u 6 months  Acute: Continue Nurtec Mri BRAIN W/WO CONTRAST: MRI brain due to concerning symptoms of morning headaches, positional headaches,vision changes, worsening headaches  to look for space occupying mass, chiari or intracranial hypertension (pseudotumor), strokes, malignancies, vasculidities, demyelination(multiple sclerosis) or other  Prevention: Ajovy Acute: nurtec  Orders Placed This Encounter  Procedures   MR BRAIN W WO CONTRAST   Meds ordered this encounter  Medications   ondansetron  (ZOFRAN-ODT) 4 MG disintegrating tablet    Sig: Take 1-2 tablets (4-8 mg total) by mouth every 8 (eight) hours as needed.    Dispense:  30 tablet    Refill:  11   Fremanezumab-vfrm (AJOVY) 225 MG/1.5ML SOAJ    Sig: Inject 225 mg into the skin every 30 (thirty) days. Please use copay card: bIN# [id number] PCN# [id number] GRP# [id number] ID# [id number]    Dispense:  1.5 mL    Refill:  11    Please use copay card: bIN# [id number] PCN# [id number] GRP# [id number] ID# [id number]   Fremanezumab-vfrm SOSY 225 mg    Cc: Noberto Retort, MD,  Noberto Retort, MD  Naomie Dean, MD  [facility] [address] [place], [place] [address]  Phone [phone] Fax [phone]   I spent 60 minutes of face-to-face and non-face-to-face time with patient on the  1. Chronic migraine without aura without status migrainosus, not intractable   2. Chronic intractable headache, unspecified headache type   3. Worsening headaches   4. Morning headache   5. Positional headache   6. Vision changes    diagnosis.  This included previsit chart review, lab review, study review, order entry, electronic health record documentation, patient education on the different diagnostic and therapeutic options, counseling and coordination of care, risks and benefits of management, compliance, or risk factor reduction

## 2023-12-29 NOTE — Patient Instructions (Signed)
 Mri BRAIN W/WO CONTRAST Prevention: Ajovy Acute: nurtec  Fremanezumab Injection What is this medication? FREMANEZUMAB (fre ma NEZ ue mab) prevents migraines. It works by blocking a substance in the body that causes migraines. It is a monoclonal antibody. This medicine may be used for other purposes; ask your health care provider or pharmacist if you have questions. COMMON BRAND NAME(S): AJOVY What should I tell my care team before I take this medication? They need to know if you have any of these conditions: An unusual or allergic reaction to fremanezumab, other medications, foods, dyes, or preservatives Pregnant or trying to get pregnant Breast-feeding How should I use this medication? This medication is injected under the skin. You will be taught how to prepare and give it. Take it as directed on the prescription label. Keep taking it unless your care team tells you to stop. It is important that you put your used needles and syringes in a special sharps container. Do not put them in a trash can. If you do not have a sharps container, call your pharmacist or care team to get one. Talk to your care team about the use of this medication in children. Special care may be needed. Overdosage: If you think you have taken too much of this medicine contact a poison control center or emergency room at once. NOTE: This medicine is only for you. Do not share this medicine with others. What if I miss a dose? If you miss a dose, take it as soon as you can. If it is almost time for your next dose, take only that dose. Do not take double or extra doses. What may interact with this medication? Interactions are not expected. This list may not describe all possible interactions. Give your health care provider a list of all the medicines, herbs, non-prescription drugs, or dietary supplements you use. Also tell them if you smoke, drink alcohol, or use illegal drugs. Some items may interact with your  medicine. What should I watch for while using this medication? Tell your care team if your symptoms do not start to get better or if they get worse. What side effects may I notice from receiving this medication? Side effects that you should report to your care team as soon as possible: Allergic reactions or angioedema--skin rash, itching or hives, swelling of the face, eyes, lips, tongue, arms, or legs, trouble swallowing or breathing Side effects that usually do not require medical attention (report to your care team if they continue or are bothersome): Pain, redness, or irritation at injection site This list may not describe all possible side effects. Call your doctor for medical advice about side effects. You may report side effects to FDA at 1-800-FDA-1088. Where should I keep my medication? Keep out of the reach of children and pets. Store in a refrigerator or at room temperature between 20 and 25 degrees C (68 and 77 degrees F). Refrigeration (preferred): Store in the refrigerator. Do not freeze. Keep in the original container until you are ready to take it. Remove the dose from the carton about 30 minutes before it is time for you to use it. If the dose is not used, it may be stored in the original container at room temperature for 7 days. Get rid of any unused medication after the expiration date. Room Temperature: This medication may be stored at room temperature for up to 7 days. Keep it in the original container. Protect from light until time of use. If it is stored  at room temperature, get rid of any unused medication after 7 days or after it expires, whichever is first. To get rid of medications that are no longer needed or have expired: Take the medication to a medication take-back program. Check with your pharmacy or law enforcement to find a location. If you cannot return the medication, ask your pharmacist or care team how to get rid of this medication safely. NOTE: This sheet is a  summary. It may not cover all possible information. If you have questions about this medicine, talk to your doctor, pharmacist, or health care provider.  2024 Elsevier/Gold Standard (2021-12-20 00:00:00)Rimegepant Disintegrating Tablets What is this medication? RIMEGEPANT (ri ME je pant) prevents and treats migraines. It works by blocking a substance in the body that causes migraines. This medicine may be used for other purposes; ask your health care provider or pharmacist if you have questions. COMMON BRAND NAME(S): NURTEC ODT What should I tell my care team before I take this medication? They need to know if you have any of these conditions: Kidney disease Liver disease An unusual or allergic reaction to rimegepant, other medications, foods, dyes, or preservatives Pregnant or trying to get pregnant Breast-feeding How should I use this medication? Take this medication by mouth. Take it as directed on the prescription label. Leave the tablet in the sealed pack until you are ready to take it. With dry hands, open the pack and gently remove the tablet. If the tablet breaks or crumbles, throw it away. Use a new tablet. Place the tablet in the mouth and allow it to dissolve. Then, swallow it. Do not cut, crush, or chew this medication. You do not need water to take this medication. Talk to your care team about the use of this medication in children. Special care may be needed. Overdosage: If you think you have taken too much of this medicine contact a poison control center or emergency room at once. NOTE: This medicine is only for you. Do not share this medicine with others. What if I miss a dose? This does not apply. This medication is not for regular use. What may interact with this medication? Certain medications for fungal infections, such as fluconazole, itraconazole Rifampin This list may not describe all possible interactions. Give your health care provider a list of all the medicines,  herbs, non-prescription drugs, or dietary supplements you use. Also tell them if you smoke, drink alcohol, or use illegal drugs. Some items may interact with your medicine. What should I watch for while using this medication? Visit your care team for regular checks on your progress. Tell your care team if your symptoms do not start to get better or if they get worse. What side effects may I notice from receiving this medication? Side effects that you should report to your care team as soon as possible: Allergic reactions--skin rash, itching, hives, swelling of the face, lips, tongue, or throat Side effects that usually do not require medical attention (report to your care team if they continue or are bothersome): Nausea Stomach pain This list may not describe all possible side effects. Call your doctor for medical advice about side effects. You may report side effects to FDA at 1-800-FDA-1088. Where should I keep my medication? Keep out of the reach of children and pets. Store at room temperature between 20 and 25 degrees C (68 and 77 degrees F). Get rid of any unused medication after the expiration date. To get rid of medications that are no longer  needed or have expired: Take the medication to a medication take-back program. Check with your pharmacy or law enforcement to find a location. If you cannot return the medication, check the label or package insert to see if the medication should be thrown out in the garbage or flushed down the toilet. If you are not sure, ask your care team. If it is safe to put it in the trash, take the medication out of the container. Mix the medication with cat litter, dirt, coffee grounds, or other unwanted substance. Seal the mixture in a bag or container. Put it in the trash. NOTE: This sheet is a summary. It may not cover all possible information. If you have questions about this medicine, talk to your doctor, pharmacist, or health care provider.  2024  Elsevier/Gold Standard (2021-12-18 00:00:00)

## 2023-12-30 ENCOUNTER — Encounter: Payer: Self-pay | Admitting: Neurology

## 2023-12-31 ENCOUNTER — Telehealth: Payer: Self-pay | Admitting: Pharmacist

## 2023-12-31 MED ORDER — EMGALITY 120 MG/ML ~~LOC~~ SOAJ: 120.0000 mg | SUBCUTANEOUS | 11 refills | Status: DC

## 2023-12-31 NOTE — Telephone Encounter (Signed)
 Pharmacy Patient Advocate Encounter  Received notification from [facility] that Prior Authorization for Emgality 120MG /ML auto-injectors (migraine) has been APPROVED from 12/31/2023 to 12/30/2024   PA #/Case ID/Reference #: [id number]  Pharmacy notified

## 2023-12-31 NOTE — Telephone Encounter (Signed)
 Emgality 120 mg SQ every 30 days prescription sent to [facility]. Canceled Ajovy.

## 2023-12-31 NOTE — Telephone Encounter (Signed)
 Pharmacy Patient Advocate Encounter   Received notification from Patient Pharmacy that prior authorization for Emgality 120MG /ML auto-injectors (migraine) is required/requested.   Insurance verification completed.   The patient is insured through [employer] [facility] .   Per test claim: PA required; PA submitted to above mentioned insurance via CoverMyMeds Key/confirmation #/EOC [id number] Status is pending

## 2024-01-05 ENCOUNTER — Ambulatory Visit: Payer: 59

## 2024-01-05 ENCOUNTER — Encounter: Payer: Self-pay | Admitting: Neurology

## 2024-01-05 DIAGNOSIS — R519 Headache, unspecified: Secondary | ICD-10-CM

## 2024-01-05 DIAGNOSIS — G8929 Other chronic pain: Secondary | ICD-10-CM

## 2024-01-05 DIAGNOSIS — R51 Headache with orthostatic component, not elsewhere classified: Secondary | ICD-10-CM

## 2024-01-05 DIAGNOSIS — H539 Unspecified visual disturbance: Secondary | ICD-10-CM | POA: Diagnosis not present

## 2024-01-05 MED ORDER — GADOBENATE DIMEGLUMINE 529 MG/ML IV SOLN
20.0000 mL | Freq: Once | INTRAVENOUS | Status: AC | PRN
  Administered 2024-01-05: 20 mL via INTRAVENOUS

## 2024-01-06 ENCOUNTER — Encounter: Payer: Self-pay | Admitting: Neurology

## 2024-01-19 ENCOUNTER — Ambulatory Visit: Payer: 59 | Admitting: Neurology

## 2024-03-22 ENCOUNTER — Encounter: Payer: Self-pay | Admitting: Neurology

## 2024-07-06 ENCOUNTER — Telehealth: Payer: Self-pay | Admitting: Neurology

## 2024-07-06 NOTE — Telephone Encounter (Signed)
 LVM and sent mychart msg informing pt of appt change - MD schedule change

## 2024-07-13 ENCOUNTER — Telehealth: Payer: 59 | Admitting: Neurology

## 2024-08-12 ENCOUNTER — Telehealth: Admitting: Neurology

## 2024-08-16 ENCOUNTER — Telehealth: Admitting: Adult Health

## 2024-08-16 DIAGNOSIS — G43709 Chronic migraine without aura, not intractable, without status migrainosus: Secondary | ICD-10-CM | POA: Diagnosis not present

## 2024-08-16 MED ORDER — EMGALITY 120 MG/ML ~~LOC~~ SOAJ: 120.0000 mg | SUBCUTANEOUS | 11 refills | Status: AC

## 2024-08-16 MED ORDER — ONDANSETRON 4 MG PO TBDP: 4.0000 mg | ORAL_TABLET | Freq: Three times a day (TID) | ORAL | 11 refills | Status: AC | PRN

## 2024-08-16 NOTE — Patient Instructions (Signed)
 Your Plan:  Continue emgality   Continue zofran  for nausea     Thank you for coming to see us  at [facility]. I hope we have been able to provide you high quality care today.  You may receive a patient satisfaction survey over the next few weeks. We would appreciate your feedback and comments so that we may continue to improve ourselves and the health of our patients.

## 2024-08-16 NOTE — Progress Notes (Signed)
 PATIENT: Lauren Wilcox DOB: [birth year]  REASON FOR VISIT: follow up HISTORY FROM: patient  Virtual Visit via Video Note  I connected with Lauren Wilcox on 08/16/24 at  3:15 PM EDT by a video enabled telemedicine application located remotely at [facility] and verified that I am speaking with the correct person using two identifiers who was located at their own home in [place]   I discussed the limitations of evaluation and management by telemedicine and the availability of in person appointments. The patient expressed understanding and agreed to proceed.   PATIENT: Lauren Wilcox DOB: 1976-07-22  REASON FOR VISIT: follow up HISTORY FROM: patient  HISTORY OF PRESENT ILLNESS: Today 08/16/24  Lauren Wilcox is a 48 y.o. female with a history of migraine headaches. Returns today for follow-up.  She reports that with Emgality  her migraines have been under good control.  She is only having 1 migraine every couple of months.  Her primary care is providing her with Nurtec.  She also gets FMLA through her primary care.  She returns today for an evaluation.   Location: base of skill to frontal region  Frequency: 1 migraine every couple of months that require medication. Nurtec for abortive Duration: with nurtec headache resolves with an hour typically Aura: no Photophonia:yes  Phonophobia:yes  Nausea: yes  Vomiting: yes  Numbness: no Weakness: no Visual changes:no Gait and balance: no   HISTORY (copied from Dr. Sharion note): HPI:  Lauren Wilcox is a 48 y.o. female here as requested by Arloa Elsie SAUNDERS, MD for migraines. has Hx of migraines; Elevated BP without diagnosis of hypertension; BMI 50.0-59.9, adult (HCC); Menorrhagia with irregular cycle; S/P laparoscopic hysterectomy; GERD (gastroesophageal reflux disease); Restless leg syndrome; Acquired hypothyroidism; Allergic rhinitis; Anxiety disorder; Hyperlipidemia; Irritable bowel syndrome; Major  depression single episode, in partial remission (HCC); Melasma; Morbid obesity (HCC); Stage 3a chronic kidney disease (HCC); Body mass index (BMI) 50.0-59.9, adult (HCC); Gastro-esophageal reflux disease without esophagitis; Restless legs syndrome; Pain of left thumb; Osteoarthritis of carpometacarpal (CMC) joint of thumb; Chronic intractable headache; and Chronic migraine without aura without status migrainosus, not intractable on their problem list.     Reviewed notes, labs and imaging from outside physicians, which showed:    Nurtec works but using more than she has. She has at least 20 total headache daysa  month and at least 10 moderate to severe migraines days. Pulsating/pounding/throbbing/, she is taking Advil  so much it is hurting her kidneys, she has nausea, vomiting, photosensitivity, phono sensitivity, osmophobia osmophobia, lights and sounds can make it worse, sleep or in a dark room helps, she has at least 10-12 moderate to severe migraine days a month, no medication overuse no aura as we know of now but she is having new vision problems, it hurts to move, I dark room helps, this has been going on for at least 6 months, she has tried and failed multiple medications, Nurtec helps acutely but she has more migraines that she can take Nurtec.  Triggers are sudden and drastic weather changes, severe thunderstorms, winter spring is the worst stress at times she has a prodrome 1 to 2 days where she is tired drained and moody during she has visit difficulty focusing and pain, nausea, she is tired, she is moody, pain in the back of the head moving towards the top, it can last 24 to 72 hours.  Can be behind her eyes, can start unilaterally or bilaterally, severe. She can wake  up with morning headaches, can worse in the morning positionally, worsening migraines, positional migraines, vision changes, she is leaving work multiple times a week, affecting affecting quality of life. Snoring but not significantly  tired, her sleep is fine, no napping, wakes refreshed.    11/2022: fREE t4 .72 bun 13 cREATININE 0.89 HGBA1C 5.5    REVIEW OF SYSTEMS: Out of a complete 14 system review of symptoms, the patient complains only of the following symptoms, and all other reviewed systems are negative.  ALLERGIES: Allergies  Allergen Reactions   Sulfa Antibiotics Other (See Comments)    unknown  Other Reaction(s): Other (See Comments)  Other Reaction(s): Other, Unknown  HAS DIFFICULTY SWALLOWING THE TABLETS    HOME MEDICATIONS: Outpatient Medications Prior to Visit  Medication Sig Dispense Refill   ALPRAZolam (XANAX) 0.25 MG tablet Take 0.125-0.25 mg by mouth 2 (two) times daily as needed for anxiety.      ALPRAZolam (XANAX) 0.25 MG tablet Take by mouth.     ALPRAZOLAM PO ALPRAZolam     aspirin-acetaminophen -caffeine (EXCEDRIN MIGRAINE) 250-250-65 MG tablet Take 1 tablet by mouth every 6 (six) hours as needed.     buPROPion  (WELLBUTRIN  XL) 300 MG 24 hr tablet Take 300 mg by mouth daily.     BUPROPION  HCL ER, XL, PO buPROPion  HCl ER (XL)     BUPROPION  HCL PO      cetirizine (ZYRTEC) 10 MG tablet Take 10 mg by mouth daily.      cetirizine (ZYRTEC) 10 MG tablet 1 (one) time each day at the same time.     levothyroxine (SYNTHROID) 25 MCG tablet Take 25 mcg by mouth daily.     LEVOTHYROXINE SODIUM PO      NURTEC 75 MG TBDP Take 1 tablet by mouth daily as needed.     pantoprazole  (PROTONIX ) 40 MG tablet Take 40 mg by mouth daily.     pantoprazole  (PROTONIX ) 40 MG tablet Take 1 tablet by mouth every morning.     PANTOPRAZOLE  SODIUM PO Pantoprazole  Sodium     pravastatin  (PRAVACHOL ) 40 MG tablet Take 40 mg by mouth daily.     pravastatin  (PRAVACHOL ) 40 MG tablet Take 1 tablet by mouth every morning.     PRAVASTATIN  SODIUM PO Pravastatin  Sodium     Rimegepant Sulfate (NURTEC PO)      rOPINIRole  (REQUIP ) 2 MG tablet Take 2 mg by mouth at bedtime.     topiramate  (TOPAMAX ) 50 MG tablet Take 50 mg by mouth  daily.     topiramate  (TOPAMAX ) 50 MG tablet 1 (one) time each day at the same time. (Patient not taking: Reported on 12/29/2023)     tretinoin (RETIN-A) 0.025 % cream 1 application in the evening to face Externally Pea size amount to whole face at night for 30 days     VENLAFAXINE  HCL ER PO Venlafaxine  HCl ER     VENLAFAXINE  HCL PO      venlafaxine  XR (EFFEXOR -XR) 75 MG 24 hr capsule Take 225 mg by mouth daily.     venlafaxine  XR (EFFEXOR -XR) 75 MG 24 hr capsule Take by mouth.     Galcanezumab -gnlm (EMGALITY ) 120 MG/ML SOAJ Inject 120 mg into the skin every 30 (thirty) days. 1 mL 11   ondansetron  (ZOFRAN -ODT) 4 MG disintegrating tablet Take 1-2 tablets (4-8 mg total) by mouth every 8 (eight) hours as needed. 30 tablet 11   No facility-administered medications prior to visit.    PAST MEDICAL HISTORY: Past Medical History:  Diagnosis Date  Anxiety    Depression    GERD (gastroesophageal reflux disease)    Hypercholesterolemia    IBS (irritable bowel syndrome)    Migraine without aura    Restless leg syndrome     PAST SURGICAL HISTORY: Past Surgical History:  Procedure Laterality Date   CHOLECYSTECTOMY     CHOLECYSTECTOMY, LAPAROSCOPIC     pt.states complication--sleepy colon and kidney   CYSTOSCOPY N/A 09/25/2020   Procedure: CYSTOSCOPY;  Surgeon: Jannis Kate Norris, MD;  Location: [facility] OR;  Service: Gynecology;  Laterality: N/A;   DERMOID CYST  EXCISION  2010   TONSILLECTOMY AND ADENOIDECTOMY     age 89   TOTAL LAPAROSCOPIC HYSTERECTOMY WITH SALPINGECTOMY N/A 09/25/2020   Procedure: TOTAL LAPAROSCOPIC HYSTERECTOMY WITH BILATERAL SALPINGECTOMY;  Surgeon: Jannis Kate Norris, MD;  Location: [facility] OR;  Service: Gynecology;  Laterality: N/A;   TUBAL LIGATION     WISDOM TOOTH EXTRACTION  11/10/2004    FAMILY HISTORY: Family History  Problem Relation Age of Onset   Migraines Mother    Heart attack Father 53       Dec   Kidney disease Brother    Cancer Maternal Grandmother  31       Dec from colon ca   Breast cancer Paternal Grandmother 72   Cancer Paternal Grandfather 54       Dec from colon ca    SOCIAL HISTORY: Social History   Socioeconomic History   Marital status: Married    Spouse name: Not on file   Number of children: Not on file   Years of education: Not on file   Highest education level: Not on file  Occupational History   Not on file  Tobacco Use   Smoking status: Never    Passive exposure: Never   Smokeless tobacco: Never  Vaping Use   Vaping status: Never Used  Substance and Sexual Activity   Alcohol use: Not Currently   Drug use: Never   Sexual activity: Not Currently    Comment: [employer]  Other Topics Concern   Not on file  Social History Narrative   Not on file   Social Drivers of Health   Financial Resource Strain: Not on file  Food Insecurity: Not on file  Transportation Needs: Not on file  Physical Activity: Not on file  Stress: Not on file  Social Connections: Not on file  Intimate Partner Violence: Not on file      PHYSICAL EXAM Generalized: Well developed, in no acute distress   Neurological examination  Mentation: Alert oriented to time, place, history taking. Follows all commands speech and language fluent Cranial nerve II-XII: Facial symmetry noted.   DIAGNOSTIC DATA (LABS, IMAGING, TESTING) - I reviewed patient records, labs, notes, testing and imaging myself where available.  Lab Results  Component Value Date   WBC 9.1 09/25/2020   HGB 12.8 09/25/2020   HCT 40.7 09/25/2020   MCV 79.2 (L) 09/25/2020   PLT 250 09/25/2020      Component Value Date/Time   NA 138 09/25/2020 1526   K 4.1 09/25/2020 1526   CL 108 09/25/2020 1526   CO2 19 (L) 09/25/2020 1526   GLUCOSE 143 (H) 09/25/2020 1526   BUN 12 09/25/2020 1526   CREATININE 0.99 09/25/2020 1526   CALCIUM 8.9 09/25/2020 1526   PROT 6.4 (L) 09/25/2020 1526   ALBUMIN 3.5 09/25/2020 1526   AST 24 09/25/2020 1526   ALT 24 09/25/2020 1526    ALKPHOS 58 09/25/2020 1526   BILITOT  0.4 09/25/2020 1526   GFRNONAA >60 09/25/2020 1526       ASSESSMENT AND PLAN 48 y.o. year old female  has a past medical history of Anxiety, Depression, GERD (gastroesophageal reflux disease), Hypercholesterolemia, IBS (irritable bowel syndrome), Migraine without aura, and Restless leg syndrome. here with:  Migraine headaches without aura  Continue Emgality  monthly injection Continue Zofran  for nausea Continue Nurtec for abortive therapy currently receiving through PCP Advised if symptoms worsen or she develops new symptoms she should let us  know Follow-up in 1 year or sooner if needed  Meds ordered this encounter  Medications   Galcanezumab -gnlm (EMGALITY ) 120 MG/ML SOAJ    Sig: Inject 120 mg into the skin every 30 (thirty) days.    Dispense:  1 mL    Refill:  11    Supervising Provider:   YAN, YIJUN [[id number]]   ondansetron  (ZOFRAN -ODT) 4 MG disintegrating tablet    Sig: Take 1-2 tablets (4-8 mg total) by mouth every 8 (eight) hours as needed.    Dispense:  30 tablet    Refill:  11    Supervising Provider:   YAN, YIJUN [[id number]]     Duwaine Russell, MSN, NP-C 08/16/2024, 3:17 PM [facility] [address] [place], [place] [address] [phone]  The patient's condition requires frequent monitoring and adjustments in the treatment plan, reflecting the ongoing complexity of care.  This provider is the continuing focal point for all needed services for this condition.

## 2024-12-08 ENCOUNTER — Encounter: Payer: Self-pay | Admitting: Adult Health

## 2024-12-08 NOTE — Telephone Encounter (Signed)
Can you triage

## 2025-08-17 ENCOUNTER — Telehealth: Admitting: Adult Health
